# Patient Record
Sex: Female | Born: 1990 | Race: White | Hispanic: No | Marital: Married | Smoking: Former smoker
Health system: Southern US, Community
[De-identification: ages and names within clinical notes are randomized; demographics above are authoritative.]

## PROBLEM LIST (undated history)

## (undated) ENCOUNTER — Inpatient Hospital Stay (HOSPITAL_COMMUNITY): Payer: Self-pay

## (undated) DIAGNOSIS — K429 Umbilical hernia without obstruction or gangrene: Secondary | ICD-10-CM

## (undated) DIAGNOSIS — B2790 Infectious mononucleosis, unspecified without complication: Secondary | ICD-10-CM

## (undated) DIAGNOSIS — Z87442 Personal history of urinary calculi: Secondary | ICD-10-CM

## (undated) DIAGNOSIS — J020 Streptococcal pharyngitis: Secondary | ICD-10-CM

## (undated) HISTORY — PX: NO PAST SURGERIES: SHX2092

---

## 2015-01-31 ENCOUNTER — Encounter (HOSPITAL_COMMUNITY): Payer: Self-pay | Admitting: Nurse Practitioner

## 2015-01-31 ENCOUNTER — Emergency Department (HOSPITAL_COMMUNITY): Payer: Commercial Managed Care - HMO

## 2015-01-31 ENCOUNTER — Emergency Department (HOSPITAL_COMMUNITY)
Admission: EM | Admit: 2015-01-31 | Discharge: 2015-01-31 | Disposition: A | Discharge status: Home / Self Care | Payer: Commercial Managed Care - HMO | Attending: Emergency Medicine | Admitting: Emergency Medicine

## 2015-01-31 DIAGNOSIS — R1012 Left upper quadrant pain: Secondary | ICD-10-CM | POA: Diagnosis not present

## 2015-01-31 DIAGNOSIS — R109 Unspecified abdominal pain: Secondary | ICD-10-CM

## 2015-01-31 DIAGNOSIS — Z3A01 Less than 8 weeks gestation of pregnancy: Secondary | ICD-10-CM | POA: Diagnosis not present

## 2015-01-31 DIAGNOSIS — Z3491 Encounter for supervision of normal pregnancy, unspecified, first trimester: Secondary | ICD-10-CM

## 2015-01-31 DIAGNOSIS — O9989 Other specified diseases and conditions complicating pregnancy, childbirth and the puerperium: Secondary | ICD-10-CM | POA: Insufficient documentation

## 2015-01-31 DIAGNOSIS — O26899 Other specified pregnancy related conditions, unspecified trimester: Secondary | ICD-10-CM

## 2015-01-31 HISTORY — DX: Infectious mononucleosis, unspecified without complication: B27.90

## 2015-01-31 LAB — LIPASE, BLOOD: Lipase: 37 U/L (ref 22–51)

## 2015-01-31 LAB — URINALYSIS, ROUTINE W REFLEX MICROSCOPIC
Bilirubin Urine: NEGATIVE
GLUCOSE, UA: NEGATIVE mg/dL
KETONES UR: NEGATIVE mg/dL
LEUKOCYTES UA: NEGATIVE
Nitrite: NEGATIVE
PROTEIN: NEGATIVE mg/dL
Specific Gravity, Urine: 1.009 (ref 1.005–1.030)
UROBILINOGEN UA: 0.2 mg/dL (ref 0.0–1.0)
pH: 7.5 (ref 5.0–8.0)

## 2015-01-31 LAB — I-STAT BETA HCG BLOOD, ED (MC, WL, AP ONLY)

## 2015-01-31 LAB — COMPREHENSIVE METABOLIC PANEL
ALK PHOS: 59 U/L (ref 38–126)
ALT: 18 U/L (ref 14–54)
ANION GAP: 10 (ref 5–15)
AST: 24 U/L (ref 15–41)
Albumin: 4.4 g/dL (ref 3.5–5.0)
BILIRUBIN TOTAL: 1.1 mg/dL (ref 0.3–1.2)
BUN: 6 mg/dL (ref 6–20)
CALCIUM: 9.2 mg/dL (ref 8.9–10.3)
CO2: 21 mmol/L — ABNORMAL LOW (ref 22–32)
CREATININE: 0.59 mg/dL (ref 0.44–1.00)
Chloride: 102 mmol/L (ref 101–111)
Glucose, Bld: 83 mg/dL (ref 65–99)
Potassium: 3.8 mmol/L (ref 3.5–5.1)
SODIUM: 133 mmol/L — AB (ref 135–145)
TOTAL PROTEIN: 7.3 g/dL (ref 6.5–8.1)

## 2015-01-31 LAB — CBC
HCT: 40.7 % (ref 36.0–46.0)
HEMOGLOBIN: 14.9 g/dL (ref 12.0–15.0)
MCH: 32 pg (ref 26.0–34.0)
MCHC: 36.6 g/dL — ABNORMAL HIGH (ref 30.0–36.0)
MCV: 87.5 fL (ref 78.0–100.0)
PLATELETS: 226 10*3/uL (ref 150–400)
RBC: 4.65 MIL/uL (ref 3.87–5.11)
RDW: 12.5 % (ref 11.5–15.5)
WBC: 7.4 10*3/uL (ref 4.0–10.5)

## 2015-01-31 LAB — URINE MICROSCOPIC-ADD ON

## 2015-01-31 NOTE — ED Provider Notes (Signed)
CSN: [id number]     Arrival date & time 01/31/15  1302 History   First MD Initiated Contact with Patient 01/31/15 1409     Chief Complaint  Patient presents with  . Abdominal Pain     (Consider location/radiation/quality/duration/timing/severity/associated sxs/prior Treatment) Patient is a 24 y.o. female presenting with abdominal pain. The history is provided by the patient (Patient states that she's been having some left upper abdominal pain. It is improving now. Patient has a history of mono recently with elevated liver studies).  Abdominal Pain Pain location:  LUQ Pain quality: aching   Pain radiates to:  Does not radiate Pain severity:  Moderate Onset quality:  Gradual Timing:  Intermittent Chronicity:  New Context: not alcohol use   Associated symptoms: no chest pain, no cough, no diarrhea, no fatigue and no hematuria     Past Medical History  Diagnosis Date  . Mononucleosis    History reviewed. No pertinent past surgical history. History reviewed. No pertinent family history. Social History  Substance Use Topics  . Smoking status: Never Smoker   . Smokeless tobacco: None  . Alcohol Use: No   OB History    No data available     Review of Systems  Constitutional: Negative for appetite change and fatigue.  HENT: Negative for congestion, ear discharge and sinus pressure.   Eyes: Negative for discharge.  Respiratory: Negative for cough.   Cardiovascular: Negative for chest pain.  Gastrointestinal: Positive for abdominal pain. Negative for diarrhea.  Genitourinary: Negative for frequency and hematuria.  Musculoskeletal: Negative for back pain.  Skin: Negative for rash.  Neurological: Negative for seizures and headaches.  Psychiatric/Behavioral: Negative for hallucinations.      Allergies  Bee venom and Cinnamon  Home Medications   Prior to Admission medications   Not on File   BP 113/75 mmHg  Pulse 79  Temp(Src) 98.1 F (36.7 C) (Oral)  Resp 16  Ht   (1.626 m)  Wt 139 lb 3.2 oz (63.141 kg)  BMI 23.88 kg/m2  SpO2 100%  LMP 11/07/2014 Physical Exam  Constitutional: She is oriented to person, place, and time. She appears well-developed.  HENT:  Head: Normocephalic.  Eyes: Conjunctivae and EOM are normal. No scleral icterus.  Neck: Neck supple. No thyromegaly present.  Cardiovascular: Normal rate and regular rhythm.  Exam reveals no gallop and no friction rub.   No murmur heard. Pulmonary/Chest: No stridor. She has no wheezes. She has no rales. She exhibits no tenderness.  Abdominal: She exhibits no distension. There is no tenderness. There is no rebound.  Musculoskeletal: Normal range of motion. She exhibits no edema.  Lymphadenopathy:    She has no cervical adenopathy.  Neurological: She is oriented to person, place, and time. She exhibits normal muscle tone. Coordination normal.  Skin: No rash noted. No erythema.  Psychiatric: She has a normal mood and affect. Her behavior is normal.    ED Course  Procedures (including critical care time) Labs Review Labs Reviewed  COMPREHENSIVE METABOLIC PANEL - Abnormal; Notable for the following:    Sodium 133 (*)    CO2 21 (*)    All other components within normal limits  CBC - Abnormal; Notable for the following:    MCHC 36.6 (*)    All other components within normal limits  URINALYSIS, ROUTINE W REFLEX MICROSCOPIC (NOT AT [facility]) - Abnormal; Notable for the following:    Hgb urine dipstick TRACE (*)    All other components within normal limits  URINE MICROSCOPIC-ADD ON - Abnormal; Notable for the following:    Squamous Epithelial / LPF FEW (*)    All other components within normal limits  I-STAT BETA HCG BLOOD, ED ([facility], [facility], [facility] ONLY) - Abnormal; Notable for the following:    I-stat hCG, quantitative >2000.0 (*)    All other components within normal limits  LIPASE, BLOOD  HCG, SERUM, QUALITATIVE    Imaging Review [place] Abdomen Complete  01/31/2015  CLINICAL DATA:  Left side  abdominal pain for 1 day. EXAM: ULTRASOUND ABDOMEN COMPLETE COMPARISON:  None. FINDINGS: Gallbladder: No gallstones or wall thickening visualized. No sonographic Murphy sign noted. 0.3 cm polyp is noted. Common bile duct: Diameter: 0.2 cm Liver: No focal lesion identified. Within normal limits in parenchymal echogenicity. IVC: No abnormality visualized. Pancreas: Visualized portion unremarkable. Spleen: Size and appearance within normal limits. Right Kidney: Length: 9.7 cm. Echogenicity within normal limits. No mass or hydronephrosis visualized. Left Kidney: Length: 9.3 cm. Echogenicity within normal limits. No mass or hydronephrosis visualized. Abdominal aorta: No aneurysm visualized. Other findings: None. IMPRESSION: Negative for gallstones. No acute finding. 0.3 cm gallbladder polyp noted. Electronically Signed   By: Drusilla Kannerhomas  Dalessio M.D.   On: 01/31/2015 16:27   [place] Ob Comp Less 14 Wks  01/31/2015  CLINICAL DATA:  Left lower quadrant pain during early pregnancy. Uncertain LMP. EXAM: OBSTETRIC <14 WK US AND TRANSVAGINAL OB US TECHNIQUE: Both transabdominal and transvaginal ultrasound examinations were performed for complete evaluation of the gestation as well as the maternal uterus, adnexal regions, and pelvic cul-de-sac. Transvaginal technique was performed to assess early pregnancy. COMPARISON:  None. FINDINGS: Intrauterine gestational sac: Visualized/normal in shape. Yolk sac:  Visualized Embryo:  None visualized MSD:  13  mm   6 w   1 d Maternal uterus/adnexae: Both ovaries are normal in appearance. Small right ovarian corpus luteum noted. No adnexal mass identified. Tiny amount of free fluid noted. IMPRESSION: Single 6 week intrauterine gestational sac which contains a yolk sac. Recommend followup of quantitative HCG levels, and consider followup ultrasound to assess viability in 10 days. Electronically Signed   By: Myles RosenthalJohn  Stahl M.D.   On: 01/31/2015 16:36   [place] Ob Transvaginal  01/31/2015  CLINICAL  DATA:  Left lower quadrant pain during early pregnancy. Uncertain LMP. EXAM: OBSTETRIC <14 WK US AND TRANSVAGINAL OB US TECHNIQUE: Both transabdominal and transvaginal ultrasound examinations were performed for complete evaluation of the gestation as well as the maternal uterus, adnexal regions, and pelvic cul-de-sac. Transvaginal technique was performed to assess early pregnancy. COMPARISON:  None. FINDINGS: Intrauterine gestational sac: Visualized/normal in shape. Yolk sac:  Visualized Embryo:  None visualized MSD:  13  mm   6 w   1 d Maternal uterus/adnexae: Both ovaries are normal in appearance. Small right ovarian corpus luteum noted. No adnexal mass identified. Tiny amount of free fluid noted. IMPRESSION: Single 6 week intrauterine gestational sac which contains a yolk sac. Recommend followup of quantitative HCG levels, and consider followup ultrasound to assess viability in 10 days. Electronically Signed   By: Myles RosenthalJohn  Stahl M.D.   On: 01/31/2015 16:36   I have personally reviewed and evaluated these images and lab results as part of my medical decision-making.   EKG Interpretation None      MDM   Final diagnoses:  Normal IUP (intrauterine pregnancy) on prenatal ultrasound, first trimester    Labs unremarkable except positive pregnancy. Patient had ultrasound that showed she is [redacted] weeks pregnant with IUP. We also been ultrasound  of her abdomen her spleen and liver were completely normal patient's pain was improved by discharge. She is also told that her liver enzymes have now returned to normal. She is to follow with up with OB/GYN for her pregnancy    Bethann Berkshire, MD 01/31/15 [phone]

## 2015-01-31 NOTE — ED Notes (Signed)
Pt still in Ultrasound

## 2015-01-31 NOTE — Discharge Instructions (Signed)
Follow up with a gyn-ob md

## 2015-01-31 NOTE — ED Notes (Signed)
She reports mono with splenomegaly and elevated liver enzymes in august, dx at [facility]. States she was rechecked by PCP and liver enzymes were decreasing since, she was referred for GI appt which is upcoming. She developed LUQ pain this morning which is new for her. She also had + pregnancy test this week, she has not had period since July but has had a negative pregnancy test since July prior to the one this week.

## 2015-03-01 ENCOUNTER — Encounter (HOSPITAL_COMMUNITY): Payer: Self-pay | Admitting: *Deleted

## 2015-03-01 ENCOUNTER — Inpatient Hospital Stay (HOSPITAL_COMMUNITY)
Admission: AD | Admit: 2015-03-01 | Discharge: 2015-03-01 | Disposition: A | Discharge status: Home / Self Care | Payer: Commercial Managed Care - HMO | Source: Ambulatory Visit | Attending: Obstetrics and Gynecology | Admitting: Obstetrics and Gynecology

## 2015-03-01 DIAGNOSIS — Z3A10 10 weeks gestation of pregnancy: Secondary | ICD-10-CM | POA: Insufficient documentation

## 2015-03-01 DIAGNOSIS — O210 Mild hyperemesis gravidarum: Secondary | ICD-10-CM | POA: Diagnosis present

## 2015-03-01 DIAGNOSIS — O211 Hyperemesis gravidarum with metabolic disturbance: Secondary | ICD-10-CM | POA: Insufficient documentation

## 2015-03-01 LAB — CBC
HCT: 36.2 % (ref 36.0–46.0)
Hemoglobin: 13.8 g/dL (ref 12.0–15.0)
MCH: 32.4 pg (ref 26.0–34.0)
MCHC: 38.8 g/dL — ABNORMAL HIGH (ref 30.0–36.0)
MCV: 84 fL (ref 78.0–100.0)
PLATELETS: 225 10*3/uL (ref 150–400)
RBC: 4.31 MIL/uL (ref 3.87–5.11)
RDW: 13.1 % (ref 11.5–15.5)
WBC: 7.8 10*3/uL (ref 4.0–10.5)

## 2015-03-01 LAB — URINE MICROSCOPIC-ADD ON

## 2015-03-01 LAB — COMPREHENSIVE METABOLIC PANEL
ALT: 54 U/L (ref 14–54)
AST: 32 U/L (ref 15–41)
Albumin: 4.1 g/dL (ref 3.5–5.0)
Alkaline Phosphatase: 59 U/L (ref 38–126)
Anion gap: 13 (ref 5–15)
BUN: 7 mg/dL (ref 6–20)
CALCIUM: 9.2 mg/dL (ref 8.9–10.3)
CHLORIDE: 101 mmol/L (ref 101–111)
CO2: 17 mmol/L — ABNORMAL LOW (ref 22–32)
CREATININE: 0.54 mg/dL (ref 0.44–1.00)
Glucose, Bld: 250 mg/dL — ABNORMAL HIGH (ref 65–99)
Potassium: 3.8 mmol/L (ref 3.5–5.1)
Sodium: 131 mmol/L — ABNORMAL LOW (ref 135–145)
TOTAL PROTEIN: 6.5 g/dL (ref 6.5–8.1)
Total Bilirubin: 1.8 mg/dL — ABNORMAL HIGH (ref 0.3–1.2)

## 2015-03-01 LAB — URINALYSIS, ROUTINE W REFLEX MICROSCOPIC
Glucose, UA: NEGATIVE mg/dL
Leukocytes, UA: NEGATIVE
NITRITE: NEGATIVE
Protein, ur: NEGATIVE mg/dL
Specific Gravity, Urine: >1.03 — ABNORMAL HIGH (ref 1.005–1.030)
UROBILINOGEN UA: 1 mg/dL (ref 0.0–1.0)
pH: 6 (ref 5.0–8.0)

## 2015-03-01 MED ORDER — MECLIZINE HCL 25 MG PO TABS
25.0000 mg | ORAL_TABLET | Freq: Once | ORAL | Status: AC
  Administered 2015-03-01: 25 mg via ORAL
  Filled 2015-03-01: qty 1

## 2015-03-01 MED ORDER — MECLIZINE HCL 25 MG PO TABS: 25.0000 mg | ORAL_TABLET | Freq: Three times a day (TID) | ORAL | Status: DC | PRN

## 2015-03-01 MED ORDER — FAMOTIDINE IN NACL 20-0.9 MG/50ML-% IV SOLN
20.0000 mg | Freq: Once | INTRAVENOUS | Status: AC
  Administered 2015-03-01: 20 mg via INTRAVENOUS
  Filled 2015-03-01: qty 50

## 2015-03-01 MED ORDER — OMEPRAZOLE 20 MG PO CPDR: 20.0000 mg | DELAYED_RELEASE_CAPSULE | Freq: Every day | ORAL | Status: DC

## 2015-03-01 MED ORDER — PROMETHAZINE HCL 25 MG PO TABS: 25.0000 mg | ORAL_TABLET | Freq: Four times a day (QID) | ORAL | Status: DC | PRN

## 2015-03-01 MED ORDER — DEXTROSE 5 % IN LACTATED RINGERS IV BOLUS
1000.0000 mL | Freq: Once | INTRAVENOUS | Status: AC
  Administered 2015-03-01: 1000 mL via INTRAVENOUS

## 2015-03-01 MED ORDER — M.V.I. ADULT IV INJ
Freq: Once | INTRAVENOUS | Status: AC
  Administered 2015-03-01: 20:00:00 via INTRAVENOUS
  Filled 2015-03-01: qty 1000

## 2015-03-01 NOTE — MAU Provider Note (Signed)
History     CSN: [id number]  Arrival date and time: 03/01/15 1733   None     Chief Complaint  Patient presents with  . Morning Sickness   HPI Pt is 24 yo G1P0 @ [redacted]w[redacted]d who presents for hyperemesis- pt has had nausea and vomiting since dx of pregnancy 01/31/2015 at ED when seen for left upper abdominal pain after recent hx of elevated liver studies. Pt had an ultrasound at that time that confirmed IUP and liver enzymes had returned to normal. Pt has an appointment at [facility] on Dec 5.  Pt called [facility] and was told to come here for her nausea and vomiting. Pt states she has not been able to keep anything down since 10/16 including water. Yesterday she vomited 7 times.  Today she has not vomited since this morning. Pt has had both diarrhea and constipation, with gelatinous type stools. RN note: Editor: Ginger R Morris, RN (Designer, jewellery)     Expand All Collapse All   Pt presents to MAU with complaints of nausea and vomiting for approximately 3 weeks ago. Pt states that she vomits 7 to 8 times a day. Denies any vaginal bleeding or abnormal discharge      Past Medical History  Diagnosis Date  . Mononucleosis     History reviewed. No pertinent past surgical history.  History reviewed. No pertinent family history.  Social History  Substance Use Topics  . Smoking status: Never Smoker   . Smokeless tobacco: None  . Alcohol Use: No    Allergies:  Allergies  Allergen Reactions  . Bee Venom   . Cinnamon     No prescriptions prior to admission    Review of Systems  Constitutional: Negative for fever and chills.  Gastrointestinal: Positive for heartburn, nausea, vomiting, diarrhea and constipation. Negative for abdominal pain.  Genitourinary: Negative for dysuria and urgency.  Neurological: Positive for dizziness. Negative for headaches.   Physical Exam   Blood pressure 130/87, pulse 88, temperature 97.7 F (36.5 C), resp. rate 16, height   (1.651 m), weight 124 lb 12.8 oz (56.609 kg), last menstrual period 11/07/2014.  Physical Exam  Nursing note and vitals reviewed. Constitutional: She is oriented to person, place, and time. She appears well-developed and well-nourished. No distress.  HENT:  Head: Normocephalic.  Eyes: Pupils are equal, round, and reactive to light.  Neck: Normal range of motion. Neck supple.  Cardiovascular: Normal rate.   Respiratory: Effort normal.  GI: Soft.  Musculoskeletal: Normal range of motion.  Neurological: She is alert and oriented to person, place, and time.  Skin: Skin is warm and dry.  Psychiatric: She has a normal mood and affect.    MAU Course  Procedures Results for orders placed or performed during the hospital encounter of 03/01/15 (from the past 24 hour(s))  Urinalysis, Routine w reflex microscopic (not at [facility])     Status: Abnormal   Collection Time: 03/01/15  5:55 PM  Result Value Ref Range   Color, Urine AMBER (A) YELLOW   APPearance CLEAR CLEAR   Specific Gravity, Urine >1.030 (H) 1.005 - 1.030   pH 6.0 5.0 - 8.0   Glucose, UA NEGATIVE NEGATIVE mg/dL   Hgb urine dipstick LARGE (A) NEGATIVE   Bilirubin Urine SMALL (A) NEGATIVE   Ketones, ur >80 (A) NEGATIVE mg/dL   Protein, ur NEGATIVE NEGATIVE mg/dL   Urobilinogen, UA 1.0 0.0 - 1.0 mg/dL   Nitrite NEGATIVE NEGATIVE   Leukocytes, UA NEGATIVE NEGATIVE  Urine microscopic-add on     Status: Abnormal   Collection Time: 03/01/15  5:55 PM  Result Value Ref Range   Squamous Epithelial / LPF MANY (A) RARE   WBC, UA 0-2 <3 WBC/hpf   RBC / HPF 11-20 <3 RBC/hpf   Bacteria, UA MANY (A) RARE  CBC     Status: Abnormal   Collection Time: 03/01/15  7:00 PM  Result Value Ref Range   WBC 7.8 4.0 - 10.5 K/uL   RBC 4.31 3.87 - 5.11 MIL/uL   Hemoglobin 13.8 12.0 - 15.0 g/dL   HCT 16.136.2 09.636.0 - 04.546.0 %   MCV 84.0 78.0 - 100.0 fL   MCH 32.4 26.0 - 34.0 pg   MCHC 38.8 (H) 30.0 - 36.0 g/dL   RDW 40.913.1 81.111.5 - 91.415.5 %   Platelets 225 150  - 400 K/uL  Comprehensive metabolic panel     Status: Abnormal   Collection Time: 03/01/15  7:00 PM  Result Value Ref Range   Sodium 131 (L) 135 - 145 mmol/L   Potassium 3.8 3.5 - 5.1 mmol/L   Chloride 101 101 - 111 mmol/L   CO2 17 (L) 22 - 32 mmol/L   Glucose, Bld 250 (H) 65 - 99 mg/dL   BUN 7 6 - 20 mg/dL   Creatinine, Ser 7.820.54 0.44 - 1.00 mg/dL   Calcium 9.2 8.9 - 95.610.3 mg/dL   Total Protein 6.5 6.5 - 8.1 g/dL   Albumin 4.1 3.5 - 5.0 g/dL   AST 32 15 - 41 U/L   ALT 54 14 - 54 U/L   Alkaline Phosphatase 59 38 - 126 U/L   Total Bilirubin 1.8 (H) 0.3 - 1.2 mg/dL   GFR calc non Af Amer >60 >60 mL/min   GFR calc Af Amer >60 >60 mL/min   Anion gap 13 5 - 15  +FHR with doppler IV #1- D5LR      #2 LR with MVI; prevacid 20mg  IVPB meclezine 25mg  PO given Pt able to tolerate PO fluids and crackers Pt has not vomited while in MAU Discussed with Dr. Henderson CloudHorvath  Assessment and Plan  Hyperemesis Gravidarium with dehydration RX meclezine, prilosec and phenergan Viable 4085w2d IUP [facility]   LINEBERRY,SUSAN 03/01/2015, 6:56 PM

## 2015-03-01 NOTE — Discharge Instructions (Signed)

## 2015-03-01 NOTE — MAU Note (Signed)
Pt presents to MAU with complaints of nausea and vomiting for approximately 3 weeks ago. Pt states that she vomits 7 to 8 times a day. Denies any vaginal bleeding or abnormal discharge

## 2015-03-02 LAB — URINE CULTURE: CULTURE: NO GROWTH

## 2015-03-22 ENCOUNTER — Other Ambulatory Visit: Payer: Self-pay | Admitting: Obstetrics

## 2015-03-22 LAB — OB RESULTS CONSOLE ABO/RH: RH Type: POSITIVE

## 2015-03-22 LAB — OB RESULTS CONSOLE RPR: RPR: NONREACTIVE

## 2015-03-22 LAB — OB RESULTS CONSOLE HEPATITIS B SURFACE ANTIGEN: HEP B S AG: NEGATIVE

## 2015-03-22 LAB — OB RESULTS CONSOLE ANTIBODY SCREEN: Antibody Screen: NEGATIVE

## 2015-03-22 LAB — OB RESULTS CONSOLE RUBELLA ANTIBODY, IGM: Rubella: IMMUNE

## 2015-03-22 LAB — OB RESULTS CONSOLE GC/CHLAMYDIA
Chlamydia: NEGATIVE
GC PROBE AMP, GENITAL: NEGATIVE

## 2015-03-22 LAB — OB RESULTS CONSOLE HIV ANTIBODY (ROUTINE TESTING): HIV: NONREACTIVE

## 2015-03-23 LAB — CYTOLOGY - PAP

## 2015-04-18 NOTE — L&D Delivery Note (Signed)
Delivery Note Patient pushed for 2 hours after it was noted that she was C/C/+2. At 3:49 PM a viable and healthy female was delivered via Vaginal, Spontaneous Delivery (Presentation: Middle Occiput Anterior).  APGAR: 9, 9; weight 7 lb 11.8 oz (3510 g).   Placenta status: Intact, Spontaneous.  Cord: 3 vessels with the following complications: None.    Anesthesia: Epidural  Episiotomy: None Lacerations: 2nd degree Suture Repair: 2.0 vicryl Est. Blood Loss (mL): 300  Mom to postpartum.  Baby to Couplet care / Skin to Skin.  [name], [name] STACIA 09/28/2015, 10:41 PM

## 2015-05-19 DIAGNOSIS — J020 Streptococcal pharyngitis: Secondary | ICD-10-CM

## 2015-05-19 HISTORY — DX: Streptococcal pharyngitis: J02.0

## 2015-05-30 ENCOUNTER — Inpatient Hospital Stay (HOSPITAL_COMMUNITY)
Admission: AD | Admit: 2015-05-30 | Discharge: 2015-05-30 | Disposition: A | Discharge status: Home / Self Care | Payer: Commercial Managed Care - HMO | Source: Ambulatory Visit | Attending: Obstetrics and Gynecology | Admitting: Obstetrics and Gynecology

## 2015-05-30 ENCOUNTER — Encounter (HOSPITAL_COMMUNITY): Payer: Self-pay | Admitting: Student

## 2015-05-30 DIAGNOSIS — K529 Noninfective gastroenteritis and colitis, unspecified: Secondary | ICD-10-CM | POA: Diagnosis not present

## 2015-05-30 DIAGNOSIS — K5289 Other specified noninfective gastroenteritis and colitis: Secondary | ICD-10-CM | POA: Diagnosis not present

## 2015-05-30 DIAGNOSIS — O26892 Other specified pregnancy related conditions, second trimester: Secondary | ICD-10-CM | POA: Insufficient documentation

## 2015-05-30 DIAGNOSIS — O9989 Other specified diseases and conditions complicating pregnancy, childbirth and the puerperium: Secondary | ICD-10-CM | POA: Diagnosis not present

## 2015-05-30 DIAGNOSIS — Z3A23 23 weeks gestation of pregnancy: Secondary | ICD-10-CM | POA: Insufficient documentation

## 2015-05-30 DIAGNOSIS — R197 Diarrhea, unspecified: Secondary | ICD-10-CM | POA: Diagnosis present

## 2015-05-30 DIAGNOSIS — R112 Nausea with vomiting, unspecified: Secondary | ICD-10-CM | POA: Diagnosis present

## 2015-05-30 HISTORY — DX: Streptococcal pharyngitis: J02.0

## 2015-05-30 LAB — URINE MICROSCOPIC-ADD ON

## 2015-05-30 LAB — URINALYSIS, ROUTINE W REFLEX MICROSCOPIC
GLUCOSE, UA: 250 mg/dL — AB
LEUKOCYTES UA: NEGATIVE
NITRITE: NEGATIVE
PH: 6 (ref 5.0–8.0)
Protein, ur: 30 mg/dL — AB

## 2015-05-30 LAB — COMPREHENSIVE METABOLIC PANEL
ALBUMIN: 3.1 g/dL — AB (ref 3.5–5.0)
ALK PHOS: 74 U/L (ref 38–126)
ALT: 18 U/L (ref 14–54)
ANION GAP: 10 (ref 5–15)
AST: 37 U/L (ref 15–41)
BILIRUBIN TOTAL: 3.3 mg/dL — AB (ref 0.3–1.2)
BUN: 11 mg/dL (ref 6–20)
CALCIUM: 8.4 mg/dL — AB (ref 8.9–10.3)
CO2: 20 mmol/L — AB (ref 22–32)
CREATININE: 0.43 mg/dL — AB (ref 0.44–1.00)
Chloride: 103 mmol/L (ref 101–111)
GFR calc Af Amer: >60 mL/min (ref >60)
GFR calc non Af Amer: >60 mL/min (ref >60)
GLUCOSE: 140 mg/dL — AB (ref 65–99)
Potassium: 4.1 mmol/L (ref 3.5–5.1)
SODIUM: 133 mmol/L — AB (ref 135–145)
TOTAL PROTEIN: 6.7 g/dL (ref 6.5–8.1)

## 2015-05-30 LAB — CBC
HEMATOCRIT: 30.5 % — AB (ref 36.0–46.0)
HEMOGLOBIN: 11.2 g/dL — AB (ref 12.0–15.0)
MCH: 33.7 pg (ref 26.0–34.0)
MCHC: 36.7 g/dL — AB (ref 30.0–36.0)
MCV: 91.9 fL (ref 78.0–100.0)
Platelets: 220 10*3/uL (ref 150–400)
RBC: 3.32 MIL/uL — AB (ref 3.87–5.11)
RDW: 13.1 % (ref 11.5–15.5)
WBC: 7.4 10*3/uL (ref 4.0–10.5)

## 2015-05-30 MED ORDER — DEXTROSE 5 % IN LACTATED RINGERS IV BOLUS
1000.0000 mL | Freq: Once | INTRAVENOUS | Status: AC
  Administered 2015-05-30: 1000 mL via INTRAVENOUS

## 2015-05-30 NOTE — MAU Provider Note (Signed)
History     CSN: [id number]  Arrival date and time: 05/30/15 1537   First Provider Initiated Contact with Patient 05/30/15 1718      Chief Complaint  Patient presents with  . Emesis  . Diarrhea   HPI Comments: Melinda Phillips is a 25 y.o. G1P0 at [redacted]w[redacted]d who presents with n/v/d. Was diagnosed with strep throat at urgent care last Thursday & started on amoxicillin. N/v/d since day after starting amoxicillin.  Denies abdominal pain, vaginal bleeding, or LOF. Positive fetal movement.   Emesis  This is a new problem. Episode onset: x 2 days. The problem occurs 2 to 4 times per day. The problem has been unchanged. The emesis has an appearance of stomach contents. There has been no fever. Associated symptoms include coughing and diarrhea. Pertinent negatives include no abdominal pain, chills, dizziness, fever or headaches. She has tried nothing (has phenergan at home but didnt' know if she could take it with abx) for the symptoms.  Diarrhea  This is a new problem. Episode onset: x2 days. The problem occurs 2 to 4 times per day. The problem has been unchanged. Diarrhea characteristics: loose. The patient states that diarrhea does not awaken her from sleep. Associated symptoms include coughing and vomiting. Pertinent negatives include no abdominal pain, chills, fever or headaches. Nothing aggravates the symptoms. Risk factors include recent antibiotic use. She has tried nothing for the symptoms.    OB History    Gravida Para Term Preterm AB TAB SAB Ectopic Multiple Living   1               Past Medical History  Diagnosis Date  . Mononucleosis   . Strep throat 05/2015    Past Surgical History  Procedure Laterality Date  . No past surgeries      History reviewed. No pertinent family history.  Social History  Substance Use Topics  . Smoking status: Never Smoker   . Smokeless tobacco: None  . Alcohol Use: No    Allergies:  Allergies  Allergen Reactions  . Bee Venom Anaphylaxis   . Cinnamon Anaphylaxis    No prescriptions prior to admission    Review of Systems  Constitutional: Negative.  Negative for fever and chills.  HENT: Positive for sore throat (reports symptom improvement since abx).   Respiratory: Positive for cough.   Gastrointestinal: Positive for vomiting and diarrhea. Negative for abdominal pain.  Genitourinary: Negative.   Neurological: Negative for dizziness and headaches.   Physical Exam   Blood pressure 118/76, pulse 98, temperature 97.6 F (36.4 C), temperature source Oral, resp. rate 18, last menstrual period 11/07/2014.  Physical Exam  Nursing note and vitals reviewed. Constitutional: She is oriented to person, place, and time. She appears well-developed and well-nourished. No distress.  HENT:  Head: Normocephalic and atraumatic.  Eyes: Conjunctivae are normal. Right eye exhibits no discharge. Left eye exhibits no discharge. No scleral icterus.  Neck: Normal range of motion.  Cardiovascular: Normal rate, regular rhythm and normal heart sounds.   No murmur heard. Respiratory: Effort normal and breath sounds normal. No respiratory distress. She has no wheezes.  GI: Soft. Bowel sounds are normal. There is no tenderness.  Neurological: She is alert and oriented to person, place, and time.  Skin: Skin is warm and dry. She is not diaphoretic.  Psychiatric: She has a normal mood and affect. Her behavior is normal. Judgment and thought content normal.   Fetal Tracing:  Baseline: 150  - active fetus, very difficult to  trace. Intermittent tracing Variability: moderate Accelerations:  Decelerations:  Toco: none   MAU Course  Procedures Results for orders placed or performed during the hospital encounter of 05/30/15 (from the past 24 hour(s))  Urinalysis, Routine w reflex microscopic (not at [facility])     Status: Abnormal   Collection Time: 05/30/15  3:45 PM  Result Value Ref Range   Color, Urine AMBER (A) YELLOW   APPearance HAZY (A)  CLEAR   Specific Gravity, Urine >1.030 (H) 1.005 - 1.030   pH 6.0 5.0 - 8.0   Glucose, UA 250 (A) NEGATIVE mg/dL   Hgb urine dipstick TRACE (A) NEGATIVE   Bilirubin Urine LARGE (A) NEGATIVE   Ketones, ur >80 (A) NEGATIVE mg/dL   Protein, ur 30 (A) NEGATIVE mg/dL   Nitrite NEGATIVE NEGATIVE   Leukocytes, UA NEGATIVE NEGATIVE  Urine microscopic-add on     Status: Abnormal   Collection Time: 05/30/15  3:45 PM  Result Value Ref Range   Squamous Epithelial / LPF 6-30 (A) NONE SEEN   WBC, UA 0-5 0 - 5 WBC/hpf   RBC / HPF 0-5 0 - 5 RBC/hpf   Bacteria, UA FEW (A) NONE SEEN  CBC     Status: Abnormal   Collection Time: 05/30/15  4:53 PM  Result Value Ref Range   WBC 7.4 4.0 - 10.5 K/uL   RBC 3.32 (L) 3.87 - 5.11 MIL/uL   Hemoglobin 11.2 (L) 12.0 - 15.0 g/dL   HCT 16.1 (L) 09.6 - 04.5 %   MCV 91.9 78.0 - 100.0 fL   MCH 33.7 26.0 - 34.0 pg   MCHC 36.7 (H) 30.0 - 36.0 g/dL   RDW 40.9 81.1 - 91.4 %   Platelets 220 150 - 400 K/uL  Comprehensive metabolic panel     Status: Abnormal   Collection Time: 05/30/15  4:53 PM  Result Value Ref Range   Sodium 133 (L) 135 - 145 mmol/L   Potassium 4.1 3.5 - 5.1 mmol/L   Chloride 103 101 - 111 mmol/L   CO2 20 (L) 22 - 32 mmol/L   Glucose, Bld 140 (H) 65 - 99 mg/dL   BUN 11 6 - 20 mg/dL   Creatinine, Ser 7.82 (L) 0.44 - 1.00 mg/dL   Calcium 8.4 (L) 8.9 - 10.3 mg/dL   Total Protein 6.7 6.5 - 8.1 g/dL   Albumin 3.1 (L) 3.5 - 5.0 g/dL   AST 37 15 - 41 U/L   ALT 18 14 - 54 U/L   Alkaline Phosphatase 74 38 - 126 U/L   Total Bilirubin 3.3 (H) 0.3 - 1.2 mg/dL   GFR calc non Af Amer >60 >60 mL/min   GFR calc Af Amer >60 >60 mL/min   Anion gap 10 5 - 15    MDM IV started, D5LR bolus Patient denies nausea at this time. Has not had episode of diarrhea since this morning.  1754- S/w Dr. Dareen Piano. Pt has f/u in office on Thursday. Ok to discharge home.   Assessment and Plan  A: 1. Other noninfectious gastroenteritis     P; Discharge home Continue  antibiotics as prescribed Can take phenergan prn n/v Discussed reasons to return Keep f/u appt with OB  Judeth Horn 05/30/2015, 7:26 PM

## 2015-05-30 NOTE — Discharge Instructions (Signed)

## 2015-05-30 NOTE — MAU Note (Signed)
Pt reports she was Dx with strep throat last week. Taking amoxacillian and musinex. Throat feels better but is c/o N/V/D. Has not kep anything down since Friday.3

## 2015-08-26 LAB — OB RESULTS CONSOLE GC/CHLAMYDIA
CHLAMYDIA, DNA PROBE: NEGATIVE
Gonorrhea: NEGATIVE

## 2015-08-26 LAB — OB RESULTS CONSOLE GBS: GBS: NEGATIVE

## 2015-09-21 ENCOUNTER — Encounter (HOSPITAL_COMMUNITY): Payer: Self-pay | Admitting: *Deleted

## 2015-09-21 ENCOUNTER — Inpatient Hospital Stay (HOSPITAL_COMMUNITY)
Admission: AD | Admit: 2015-09-21 | Discharge: 2015-09-21 | Disposition: A | Discharge status: Home / Self Care | Payer: Commercial Managed Care - HMO | Source: Ambulatory Visit | Attending: Obstetrics and Gynecology | Admitting: Obstetrics and Gynecology

## 2015-09-21 DIAGNOSIS — R197 Diarrhea, unspecified: Secondary | ICD-10-CM

## 2015-09-21 DIAGNOSIS — O99613 Diseases of the digestive system complicating pregnancy, third trimester: Secondary | ICD-10-CM

## 2015-09-21 DIAGNOSIS — Z3A39 39 weeks gestation of pregnancy: Secondary | ICD-10-CM | POA: Diagnosis not present

## 2015-09-21 DIAGNOSIS — K529 Noninfective gastroenteritis and colitis, unspecified: Secondary | ICD-10-CM | POA: Insufficient documentation

## 2015-09-21 DIAGNOSIS — O26893 Other specified pregnancy related conditions, third trimester: Secondary | ICD-10-CM | POA: Insufficient documentation

## 2015-09-21 DIAGNOSIS — O219 Vomiting of pregnancy, unspecified: Secondary | ICD-10-CM

## 2015-09-21 LAB — URINE MICROSCOPIC-ADD ON: RBC / HPF: NONE SEEN RBC/hpf (ref 0–5)

## 2015-09-21 LAB — URINALYSIS, ROUTINE W REFLEX MICROSCOPIC
Bilirubin Urine: NEGATIVE
Glucose, UA: NEGATIVE mg/dL
HGB URINE DIPSTICK: NEGATIVE
KETONES UR: NEGATIVE mg/dL
Nitrite: NEGATIVE
PROTEIN: NEGATIVE mg/dL
Specific Gravity, Urine: 1.01 (ref 1.005–1.030)
pH: 7 (ref 5.0–8.0)

## 2015-09-21 NOTE — MAU Provider Note (Signed)
Chief Complaint:  Nausea; Emesis; and Diarrhea   First Provider Initiated Contact with Patient 09/21/15 1251      HPI: Melinda ShieldsCaitlin Phillips is a 25 y.o. G1P0 at [id number] who presents to maternity admissions reporting onset of nausea and vomiting 2 days ago, with vomiting 3-4x daily until today, then onset of diarrhea with 4 episodes today.  She also felt dizziness last night which resolved.  She reports she took phenergan last night, which helped, but has not taken any medications today.  She called the office and was told to come to the hospital if she had diarrhea because she might be dehydrated.  She reports good fetal movement, denies LOF, vaginal bleeding, vaginal itching/burning, urinary symptoms, h/a, dizziness, n/v, or fever/chills.    HPI  Past Medical History: Past Medical History  Diagnosis Date  . Mononucleosis   . Strep throat 05/2015    Past obstetric history: OB History  Gravida Para Term Preterm AB SAB TAB Ectopic Multiple Living  1             # Outcome Date GA Lbr Len/2nd Weight Sex Delivery Anes PTL Lv  1 Current               Past Surgical History: Past Surgical History  Procedure Laterality Date  . No past surgeries      Family History: Family History  Problem Relation Age of Onset  . Hypertension Mother   . Hypertension Father   . Hypertension Maternal Grandmother   . Diabetes Maternal Grandmother   . Hypertension Maternal Grandfather   . Hypertension Paternal Grandmother   . Hypertension Paternal Grandfather   . Heart disease Paternal Grandfather     Social History: Social History  Substance Use Topics  . Smoking status: Never Smoker   . Smokeless tobacco: None  . Alcohol Use: No    Allergies:  Allergies  Allergen Reactions  . Bee Venom Anaphylaxis  . Cinnamon Anaphylaxis    Meds:  Prescriptions prior to admission  Medication Sig Dispense Refill Last Dose  . acetaminophen (TYLENOL) 500 MG tablet Take 500 mg by mouth every 6 (six) hours as  needed.   09/19/2015  . omeprazole (PRILOSEC) 20 MG capsule Take 1 capsule (20 mg total) by mouth daily. 30 capsule 0 09/07/2015  . Prenatal Vit-Fe Fumarate-FA (PRENATAL MULTIVITAMIN) TABS tablet Take 1 tablet by mouth daily at 12 noon.   09/19/2015  . promethazine (PHENERGAN) 25 MG tablet Take 1 tablet (25 mg total) by mouth every 6 (six) hours as needed for nausea or vomiting. 30 tablet 0 09/20/2015 at Unknown time  . meclizine (ANTIVERT) 25 MG tablet Take 1 tablet (25 mg total) by mouth 3 (three) times daily as needed for dizziness. (Patient not taking: Reported on 09/21/2015) 30 tablet 0 Past Month at Unknown time    ROS:  Review of Systems  Constitutional: Negative for fever, chills and fatigue.  Respiratory: Negative for shortness of breath.   Cardiovascular: Negative for chest pain.  Gastrointestinal: Positive for nausea, vomiting and diarrhea.  Genitourinary: Negative for dysuria, flank pain, vaginal bleeding, vaginal discharge, difficulty urinating, vaginal pain and pelvic pain.  Neurological: Negative for dizziness and headaches.  Psychiatric/Behavioral: Negative.      I have reviewed patient's Past Medical Hx, Surgical Hx, Family Hx, Social Hx, medications and allergies.   Physical Exam   Patient Vitals for the past 24 hrs:  BP Temp Temp src Pulse Resp SpO2  09/21/15 1128 102/89 mmHg 97.6 F (36.4 C)  Oral 80 16 100 %   Constitutional: Well-developed, well-nourished female in no acute distress.  Cardiovascular: normal rate Respiratory: normal effort GI: Abd soft, non-tender, gravid appropriate for gestational age.  MS: Extremities nontender, no edema, normal ROM Neurologic: Alert and oriented x 4.  GU: Neg CVAT.  PELVIC EXAM: Cervix pink, visually closed, without lesion, scant white creamy discharge, vaginal walls and external genitalia normal Bimanual exam: Cervix 0/long/high, firm, anterior, neg CMT, uterus nontender, nonenlarged, adnexa without tenderness, enlargement, or  mass  Dilation: 1 Effacement (%): 50 Cervical Position: Posterior Presentation: Vertex Exam by:: Lias Leftwich-Kirby CNM  FHT:  Baseline 125 , moderate variability, accelerations present, no decelerations Contractions: irritability with occasional contractions that are mild to palpation   Labs: Results for orders placed or performed during the hospital encounter of 09/21/15 (from the past 24 hour(s))  Urinalysis, Routine w reflex microscopic (not at [facility])     Status: Abnormal   Collection Time: 09/21/15 11:00 AM  Result Value Ref Range   Color, Urine YELLOW YELLOW   APPearance CLEAR CLEAR   Specific Gravity, Urine 1.010 1.005 - 1.030   pH 7.0 5.0 - 8.0   Glucose, UA NEGATIVE NEGATIVE mg/dL   Hgb urine dipstick NEGATIVE NEGATIVE   Bilirubin Urine NEGATIVE NEGATIVE   Ketones, ur NEGATIVE NEGATIVE mg/dL   Protein, ur NEGATIVE NEGATIVE mg/dL   Nitrite NEGATIVE NEGATIVE   Leukocytes, UA TRACE (A) NEGATIVE  Urine microscopic-add on     Status: Abnormal   Collection Time: 09/21/15 11:00 AM  Result Value Ref Range   Squamous Epithelial / LPF 6-30 (A) NONE SEEN   WBC, UA 6-30 0 - 5 WBC/hpf   RBC / HPF NONE SEEN 0 - 5 RBC/hpf   Bacteria, UA MANY (A) NONE SEEN   Casts HYALINE CASTS (A) NEGATIVE   Urine-Other MUCOUS PRESENT       Imaging:  No results found.  MAU Course/MDM: I have ordered labs and reviewed results.  No signs of dehydration or active labor.  Consult Dr Claiborne Billings.  Encouraged increased PO fluids, may take Phenergan as prescribed for nausea and OTC Imodium PRN.  Urine sent for culture r/t increased WBCs.  Pt stable at time of discharge.  Assessment: 1. Noninfectious gastroenteritis, unspecified   2. Nausea and vomiting during pregnancy   3. Diarrhea, unspecified type     Plan: Discharge home Labor precautions and fetal kick counts      Follow-up Information    Follow up with CALLAHAN, SIDNEY, DO.   Specialty:  Obstetrics and Gynecology   Why:  As  scheduled, Return to MAU as needed for emergencies   Contact information:   [address] [place] [address] [phone]        Medication List    STOP taking these medications        meclizine 25 MG tablet  Commonly known as:  ANTIVERT      TAKE these medications        acetaminophen 500 MG tablet  Commonly known as:  TYLENOL  Take 500 mg by mouth every 6 (six) hours as needed.     omeprazole 20 MG capsule  Commonly known as:  PRILOSEC  Take 1 capsule (20 mg total) by mouth daily.     prenatal multivitamin Tabs tablet  Take 1 tablet by mouth daily at 12 noon.     promethazine 25 MG tablet  Commonly known as:  PHENERGAN  Take 1 tablet (25 mg total) by  mouth every 6 (six) hours as needed for nausea or vomiting.        Sharen Counter Certified Nurse-Midwife 09/21/2015 1:18 PM

## 2015-09-21 NOTE — MAU Note (Signed)
Urine in lab 

## 2015-09-21 NOTE — MAU Note (Signed)
C/o Nausea & vomiting since Sunday morning and the diarrhea started this Am;

## 2015-09-21 NOTE — Discharge Instructions (Signed)

## 2015-09-22 LAB — CULTURE, OB URINE: Culture: NO GROWTH

## 2015-09-25 ENCOUNTER — Encounter (HOSPITAL_COMMUNITY): Payer: Self-pay | Admitting: *Deleted

## 2015-09-25 ENCOUNTER — Inpatient Hospital Stay (HOSPITAL_COMMUNITY)
Admission: AD | Admit: 2015-09-25 | Discharge: 2015-09-25 | Disposition: A | Discharge status: Home / Self Care | Payer: Commercial Managed Care - HMO | Source: Ambulatory Visit | Attending: Obstetrics and Gynecology | Admitting: Obstetrics and Gynecology

## 2015-09-25 NOTE — MAU Note (Signed)
Contractions since last night. More intense at time. Tonight pain going toward lower back and thought something might be going on. Denies LOF or bleeding. Good FM

## 2015-09-27 ENCOUNTER — Encounter (HOSPITAL_COMMUNITY): Payer: Self-pay

## 2015-09-27 ENCOUNTER — Inpatient Hospital Stay (HOSPITAL_COMMUNITY)
Admission: AD | Admit: 2015-09-27 | Discharge: 2015-09-30 | DRG: 775 | Disposition: A | Discharge status: Home / Self Care | Payer: Commercial Managed Care - HMO | Source: Ambulatory Visit | Attending: Obstetrics & Gynecology | Admitting: Obstetrics & Gynecology

## 2015-09-27 DIAGNOSIS — Z3A40 40 weeks gestation of pregnancy: Secondary | ICD-10-CM

## 2015-09-27 DIAGNOSIS — K219 Gastro-esophageal reflux disease without esophagitis: Secondary | ICD-10-CM | POA: Diagnosis present

## 2015-09-27 DIAGNOSIS — O9962 Diseases of the digestive system complicating childbirth: Secondary | ICD-10-CM | POA: Diagnosis present

## 2015-09-27 DIAGNOSIS — Z833 Family history of diabetes mellitus: Secondary | ICD-10-CM | POA: Diagnosis not present

## 2015-09-27 DIAGNOSIS — Z87891 Personal history of nicotine dependence: Secondary | ICD-10-CM

## 2015-09-27 DIAGNOSIS — IMO0001 Reserved for inherently not codable concepts without codable children: Secondary | ICD-10-CM

## 2015-09-27 DIAGNOSIS — Z8249 Family history of ischemic heart disease and other diseases of the circulatory system: Secondary | ICD-10-CM

## 2015-09-27 LAB — CBC
HCT: 28.1 % — ABNORMAL LOW (ref 36.0–46.0)
Hemoglobin: 9 g/dL — ABNORMAL LOW (ref 12.0–15.0)
MCH: 25.5 pg — ABNORMAL LOW (ref 26.0–34.0)
MCHC: 32 g/dL (ref 30.0–36.0)
MCV: 79.6 fL (ref 78.0–100.0)
Platelets: 282 10*3/uL (ref 150–400)
RBC: 3.53 MIL/uL — ABNORMAL LOW (ref 3.87–5.11)
RDW: 15.3 % (ref 11.5–15.5)
WBC: 10.6 10*3/uL — ABNORMAL HIGH (ref 4.0–10.5)

## 2015-09-27 LAB — TYPE AND SCREEN
ABO/RH(D): AB POS
ANTIBODY SCREEN: NEGATIVE

## 2015-09-27 MED ORDER — OXYTOCIN 40 UNITS IN LACTATED RINGERS INFUSION - SIMPLE MED: 2.5000 [IU]/h | INTRAVENOUS | Status: DC

## 2015-09-27 MED ORDER — OXYCODONE-ACETAMINOPHEN 5-325 MG PO TABS: 2.0000 | ORAL_TABLET | ORAL | Status: DC | PRN

## 2015-09-27 MED ORDER — ONDANSETRON HCL 4 MG/2ML IJ SOLN
4.0000 mg | Freq: Four times a day (QID) | INTRAMUSCULAR | Status: DC | PRN
  Administered 2015-09-28: 4 mg via INTRAVENOUS
  Filled 2015-09-27: qty 2

## 2015-09-27 MED ORDER — LACTATED RINGERS IV SOLN
INTRAVENOUS | Status: DC
  Administered 2015-09-28: 08:00:00 via INTRAVENOUS

## 2015-09-27 MED ORDER — OXYTOCIN BOLUS FROM INFUSION
500.0000 mL | INTRAVENOUS | Status: DC
  Administered 2015-09-28: 500 mL via INTRAVENOUS

## 2015-09-27 MED ORDER — ACETAMINOPHEN 325 MG PO TABS
650.0000 mg | ORAL_TABLET | ORAL | Status: DC | PRN
  Administered 2015-09-28: 650 mg via ORAL
  Filled 2015-09-27 (×2): qty 2

## 2015-09-27 MED ORDER — LACTATED RINGERS IV SOLN: 500.0000 mL | INTRAVENOUS | Status: DC | PRN

## 2015-09-27 MED ORDER — FLEET ENEMA 7-19 GM/118ML RE ENEM: 1.0000 | ENEMA | RECTAL | Status: DC | PRN

## 2015-09-27 MED ORDER — SOD CITRATE-CITRIC ACID 500-334 MG/5ML PO SOLN: 30.0000 mL | ORAL | Status: DC | PRN

## 2015-09-27 MED ORDER — LIDOCAINE HCL (PF) 1 % IJ SOLN
30.0000 mL | INTRAMUSCULAR | Status: DC | PRN
  Administered 2015-09-28: 30 mL via SUBCUTANEOUS
  Filled 2015-09-27: qty 30

## 2015-09-27 MED ORDER — OXYCODONE-ACETAMINOPHEN 5-325 MG PO TABS: 1.0000 | ORAL_TABLET | ORAL | Status: DC | PRN

## 2015-09-27 NOTE — MAU Note (Signed)
Pt presents complaining of contractions every 4-5 minutes for 2 hours and increased discharge. Denies bleeding or leaking. Reports good fetal movement.

## 2015-09-28 ENCOUNTER — Inpatient Hospital Stay (HOSPITAL_COMMUNITY): Payer: Commercial Managed Care - HMO | Admitting: Anesthesiology

## 2015-09-28 LAB — ABO/RH: ABO/RH(D): AB POS

## 2015-09-28 LAB — RPR: RPR: NONREACTIVE

## 2015-09-28 MED ORDER — WITCH HAZEL-GLYCERIN EX PADS
1.0000 "application " | MEDICATED_PAD | CUTANEOUS | Status: DC | PRN
  Administered 2015-09-28: 1 via TOPICAL

## 2015-09-28 MED ORDER — LACTATED RINGERS IV SOLN: 500.0000 mL | Freq: Once | INTRAVENOUS | Status: DC

## 2015-09-28 MED ORDER — EPHEDRINE 5 MG/ML INJ
10.0000 mg | INTRAVENOUS | Status: DC | PRN
  Filled 2015-09-28: qty 2

## 2015-09-28 MED ORDER — EPHEDRINE 5 MG/ML INJ: 10.0000 mg | INTRAVENOUS | Status: DC | PRN

## 2015-09-28 MED ORDER — IBUPROFEN 600 MG PO TABS
600.0000 mg | ORAL_TABLET | Freq: Four times a day (QID) | ORAL | Status: DC
  Administered 2015-09-28 – 2015-09-30 (×6): 600 mg via ORAL
  Filled 2015-09-28 (×6): qty 1

## 2015-09-28 MED ORDER — BENZOCAINE-MENTHOL 20-0.5 % EX AERO
1.0000 "application " | INHALATION_SPRAY | CUTANEOUS | Status: DC | PRN
  Administered 2015-09-28 – 2015-09-29 (×2): 1 via TOPICAL
  Filled 2015-09-28 (×2): qty 56

## 2015-09-28 MED ORDER — DIPHENHYDRAMINE HCL 50 MG/ML IJ SOLN: 12.5000 mg | INTRAMUSCULAR | Status: DC | PRN

## 2015-09-28 MED ORDER — TERBUTALINE SULFATE 1 MG/ML IJ SOLN: 0.2500 mg | Freq: Once | INTRAMUSCULAR | Status: DC | PRN

## 2015-09-28 MED ORDER — ONDANSETRON HCL 4 MG/2ML IJ SOLN: 4.0000 mg | INTRAMUSCULAR | Status: DC | PRN

## 2015-09-28 MED ORDER — PHENYLEPHRINE 40 MCG/ML (10ML) SYRINGE FOR IV PUSH (FOR BLOOD PRESSURE SUPPORT)
80.0000 ug | PREFILLED_SYRINGE | INTRAVENOUS | Status: DC | PRN
  Filled 2015-09-28: qty 5

## 2015-09-28 MED ORDER — DIBUCAINE 1 % RE OINT: 1.0000 "application " | TOPICAL_OINTMENT | RECTAL | Status: DC | PRN

## 2015-09-28 MED ORDER — TETANUS-DIPHTH-ACELL PERTUSSIS 5-2.5-18.5 LF-MCG/0.5 IM SUSP: 0.5000 mL | Freq: Once | INTRAMUSCULAR | Status: DC

## 2015-09-28 MED ORDER — ONDANSETRON HCL 4 MG PO TABS: 4.0000 mg | ORAL_TABLET | ORAL | Status: DC | PRN

## 2015-09-28 MED ORDER — PHENYLEPHRINE 40 MCG/ML (10ML) SYRINGE FOR IV PUSH (FOR BLOOD PRESSURE SUPPORT)
80.0000 ug | PREFILLED_SYRINGE | INTRAVENOUS | Status: DC | PRN
  Filled 2015-09-28: qty 10
  Filled 2015-09-28: qty 5

## 2015-09-28 MED ORDER — FENTANYL 2.5 MCG/ML BUPIVACAINE 1/10 % EPIDURAL INFUSION (WH - ANES)
14.0000 mL/h | INTRAMUSCULAR | Status: DC | PRN
  Administered 2015-09-28 (×2): 14 mL/h via EPIDURAL
  Filled 2015-09-28 (×2): qty 125

## 2015-09-28 MED ORDER — LIDOCAINE HCL (PF) 1 % IJ SOLN
INTRAMUSCULAR | Status: DC | PRN
  Administered 2015-09-28 (×2): 4 mL via EPIDURAL

## 2015-09-28 MED ORDER — COCONUT OIL OIL: 1.0000 "application " | TOPICAL_OIL | Status: DC | PRN

## 2015-09-28 MED ORDER — SIMETHICONE 80 MG PO CHEW: 80.0000 mg | CHEWABLE_TABLET | ORAL | Status: DC | PRN

## 2015-09-28 MED ORDER — SENNOSIDES-DOCUSATE SODIUM 8.6-50 MG PO TABS
2.0000 | ORAL_TABLET | ORAL | Status: DC
  Administered 2015-09-28 – 2015-09-29 (×2): 2 via ORAL
  Filled 2015-09-28 (×2): qty 2

## 2015-09-28 MED ORDER — DIPHENHYDRAMINE HCL 25 MG PO CAPS: 25.0000 mg | ORAL_CAPSULE | Freq: Four times a day (QID) | ORAL | Status: DC | PRN

## 2015-09-28 MED ORDER — ACETAMINOPHEN 325 MG PO TABS
650.0000 mg | ORAL_TABLET | ORAL | Status: DC | PRN
  Administered 2015-09-29: 650 mg via ORAL

## 2015-09-28 MED ORDER — PRENATAL MULTIVITAMIN CH
1.0000 | ORAL_TABLET | Freq: Every day | ORAL | Status: DC
  Administered 2015-09-29: 1 via ORAL
  Filled 2015-09-28: qty 1

## 2015-09-28 MED ORDER — ZOLPIDEM TARTRATE 5 MG PO TABS
5.0000 mg | ORAL_TABLET | Freq: Every evening | ORAL | Status: DC | PRN
Start: 2015-09-28 — End: 2015-09-30

## 2015-09-28 MED ORDER — OXYTOCIN 40 UNITS IN LACTATED RINGERS INFUSION - SIMPLE MED
1.0000 m[IU]/min | INTRAVENOUS | Status: DC
  Administered 2015-09-28: 2 m[IU]/min via INTRAVENOUS
  Filled 2015-09-28: qty 1000

## 2015-09-28 NOTE — Progress Notes (Signed)
Dr Ezequiel EssexGable requested that pt go to nursery to see newborn before being transferred to NICU.  Pt placed back into wheelchair and taken to nursery before being admitted

## 2015-09-28 NOTE — Anesthesia Procedure Notes (Signed)
Epidural Patient location during procedure: OB Start time: 09/28/2015 1:28 AM End time: 09/28/2015 1:34 AM  Staffing Anesthesiologist: Shona SimpsonHOLLIS, KEVIN D Performed by: anesthesiologist   Preanesthetic Checklist Completed: patient identified, site marked, surgical consent, pre-op evaluation, timeout performed, IV checked, risks and benefits discussed and monitors and equipment checked  Epidural Patient position: sitting Prep: ChloraPrep Patient monitoring: heart rate, continuous pulse ox and blood pressure Approach: midline Location: L3-L4 Injection technique: LOR saline  Needle:  Needle type: Tuohy  Needle gauge: 17 G Needle length: 9 cm Catheter type: closed end flexible Catheter size: 20 Guage Test dose: negative and 1.5% lidocaine  Assessment Events: blood not aspirated, injection not painful, no injection resistance and no paresthesia  Additional Notes LOR @ 6  Patient identified. Risks/Benefits/Options discussed with patient including but not limited to bleeding, infection, nerve damage, paralysis, failed block, incomplete pain control, headache, blood pressure changes, nausea, vomiting, reactions to medications, itching and postpartum back pain. Confirmed with bedside nurse the patient's most recent platelet count. Confirmed with patient that they are not currently taking any anticoagulation, have any bleeding history or any family history of bleeding disorders. Patient expressed understanding and wished to proceed. All questions were answered. Sterile technique was used throughout the entire procedure. Please see nursing notes for vital signs. Test dose was given through epidural catheter and negative prior to continuing to dose epidural or start infusion. Warning signs of high block given to the patient including shortness of breath, tingling/numbness in hands, complete motor block, or any concerning symptoms with instructions to call for help. Patient was given instructions on  fall risk and not to get out of bed. All questions and concerns addressed with instructions to call with any issues or inadequate analgesia.    Reason for block:procedure for pain

## 2015-09-28 NOTE — Anesthesia Pain Management Evaluation Note (Signed)
  CRNA Pain Management Visit Note  Patient: Melinda ShieldsCaitlin Lannan, 25 y.o., female  "Hello I am a member of the anesthesia team at [facility]. We have an anesthesia team available at all times to provide care throughout the hospital, including epidural management and anesthesia for C-section. I don't know your plan for the delivery whether it a natural birth, water birth, IV sedation, nitrous supplementation, doula or epidural, but we want to meet your pain goals."   1.Was your pain managed to your expectations on prior hospitalizations?   No prior hospitalizations  2.What is your expectation for pain management during this hospitalization?     Epidural  3.How can we help you reach that goal? epidural  Record the patient's initial score and the patient's pain goal.   Pain: 0 Epidural is working well  Pain Goal: 4 [facility] wants you to be able to say your pain was always managed very well.  Kinley Dozier 09/28/2015

## 2015-09-28 NOTE — Anesthesia Preprocedure Evaluation (Signed)
Anesthesia Evaluation   Patient awake    Reviewed: Allergy & Precautions, NPO status , Patient's Chart, lab work & pertinent test results  Airway Mallampati: II  TM Distance: >3 FB Neck ROM: Full    Dental  (+) Teeth Intact, Dental Advisory Given   Pulmonary former smoker,    breath sounds clear to auscultation       Cardiovascular  Rhythm:Regular Rate:Normal     Neuro/Psych negative neurological ROS  negative psych ROS   GI/Hepatic GERD  Medicated,  Endo/Other    Renal/GU   negative genitourinary   Musculoskeletal   Abdominal   Peds negative pediatric ROS (+)  Hematology negative hematology ROS (+)   Anesthesia Other Findings   Reproductive/Obstetrics (+) Pregnancy                             Lab Results  Component Value Date   WBC 10.6* 09/27/2015   HGB 9.0* 09/27/2015   HCT 28.1* 09/27/2015   MCV 79.6 09/27/2015   PLT 282 09/27/2015   No results found for: INR, PROTIME   Anesthesia Physical Anesthesia Plan  ASA: II  Anesthesia Plan: Epidural   Post-op Pain Management:    Induction:   Airway Management Planned:   Additional Equipment:   Intra-op Plan:   Post-operative Plan:   Informed Consent: I have reviewed the patients History and Physical, chart, labs and discussed the procedure including the risks, benefits and alternatives for the proposed anesthesia with the patient or authorized representative who has indicated his/her understanding and acceptance.     Plan Discussed with:   Anesthesia Plan Comments:         Anesthesia Quick Evaluation

## 2015-09-29 ENCOUNTER — Encounter (HOSPITAL_COMMUNITY): Payer: Self-pay | Admitting: *Deleted

## 2015-09-29 LAB — CBC
HCT: 25.9 % — ABNORMAL LOW (ref 36.0–46.0)
HEMOGLOBIN: 8.1 g/dL — AB (ref 12.0–15.0)
MCH: 24.9 pg — AB (ref 26.0–34.0)
MCHC: 31.3 g/dL (ref 30.0–36.0)
MCV: 79.7 fL (ref 78.0–100.0)
PLATELETS: 213 10*3/uL (ref 150–400)
RBC: 3.25 MIL/uL — AB (ref 3.87–5.11)
RDW: 15.6 % — ABNORMAL HIGH (ref 11.5–15.5)
WBC: 15.8 10*3/uL — ABNORMAL HIGH (ref 4.0–10.5)

## 2015-09-29 MED ORDER — FERROUS SULFATE 325 (65 FE) MG PO TABS
325.0000 mg | ORAL_TABLET | Freq: Two times a day (BID) | ORAL | Status: DC
  Administered 2015-09-29 – 2015-09-30 (×2): 325 mg via ORAL
  Filled 2015-09-29 (×3): qty 1

## 2015-09-29 NOTE — Progress Notes (Signed)
PPD#1 Pt states that baby was transferred to NICU. Pt is doing well. Lochia-mild VSSAF Hgb 8 IMP/ stable Plan/ Routine care

## 2015-09-29 NOTE — Progress Notes (Signed)
CSW acknowledges NICU admission.    Patient screened out for psychosocial assessment since none of the following apply:  Psychosocial stressors documented in mother or baby's chart  Gestation less than 32 weeks  Code at delivery   Infant with anomalies  Please contact the Clinical Social Worker if specific needs arise, or by MOB's request.       

## 2015-09-29 NOTE — Anesthesia Postprocedure Evaluation (Signed)
Anesthesia Post Note  Patient: Melinda ShieldsCaitlin Phillips  Procedure(s) Performed: * No procedures listed *  Patient location during evaluation: Mother Baby Anesthesia Type: Epidural Level of consciousness: awake, awake and alert and oriented Pain management: pain level controlled Vital Signs Assessment: post-procedure vital signs reviewed and stable Respiratory status: spontaneous breathing, nonlabored ventilation and respiratory function stable Cardiovascular status: stable Postop Assessment: no headache, no backache, patient able to bend at knees, no signs of nausea or vomiting and adequate PO intake Anesthetic complications: no     Last Vitals:  Filed Vitals:   09/28/15 2344 09/29/15 0627  BP: 123/67 116/72  Pulse: 79 68  Temp: 37 C 36.5 C  Resp: 18 16    Last Pain:  Filed Vitals:   09/29/15 0636  PainSc: 0-No pain   Pain Goal: Patients Stated Pain Goal: 3 (09/28/15 1623)               Donnalee CurryMalinova,Raynee Mccasland Hristova

## 2015-09-29 NOTE — Lactation Note (Signed)
This note was copied from a baby's chart. Lactation Consultation Note  Initial visit made with parents in the NICU.  Mom states her friend told her there is no reason to breastfeed if her baby is in the NICU.  Discussed benefits of breast milk for her baby and encouraged her to reconsider.  Mom is willing to begin pumping when she returns to her room.  RN has symphony pump set up and ready.  Lactation services and Providing Breastmilk For Your Baby In NICU given.  Patient Name: Melinda Phillips [id number]'BToday's Date: 09/29/2015 Reason for consult: Initial assessment;NICU baby   Maternal Data    Feeding Feeding Type: Formula Length of feed: 30 min  LATCH Score/Interventions                      Lactation Tools Discussed/Used Initiated by:: RN Date initiated:: 09/29/15   Consult Status Consult Status: Follow-up Date: 09/30/15 Follow-up type: In-patient    Huston FoleyMOULDEN, Aldea Avis S 09/29/2015, 4:05 PM

## 2015-09-29 NOTE — Progress Notes (Signed)
Dr Dareen PianoAnderson notified that mom has a 8.1 hemoglobin.  Orders received

## 2015-09-30 MED ORDER — OXYCODONE-ACETAMINOPHEN 5-325 MG PO TABS: 1.0000 | ORAL_TABLET | ORAL | Status: DC | PRN

## 2015-09-30 MED ORDER — DOCUSATE SODIUM 100 MG PO CAPS: 100.0000 mg | ORAL_CAPSULE | Freq: Two times a day (BID) | ORAL | Status: DC

## 2015-09-30 MED ORDER — IBUPROFEN 600 MG PO TABS: 600.0000 mg | ORAL_TABLET | Freq: Four times a day (QID) | ORAL | Status: DC | PRN

## 2015-09-30 NOTE — Discharge Summary (Signed)
Obstetric Discharge Summary Reason for Admission: onset of labor Prenatal Procedures: ultrasound Intrapartum Procedures: spontaneous vaginal delivery Postpartum Procedures: none Complications-Operative and Postpartum: 2nd degree perineal laceration HEMOGLOBIN  Date Value Ref Range Status  09/29/2015 8.1* 12.0 - 15.0 g/dL Final   HCT  Date Value Ref Range Status  09/29/2015 25.9* 36.0 - 46.0 % Final    Physical Exam:  General: alert, cooperative and appears stated age 24Lochia: appropriate Uterine Fundus: firm Incision: healing well DVT Evaluation: No evidence of DVT seen on physical exam.  Discharge Diagnoses: Term Pregnancy-delivered  Discharge Information: Date: 09/30/2015 Activity: pelvic rest Diet: routine Medications: Ibuprofen, Colace and Percocet Condition: improved Instructions: refer to practice specific booklet Discharge to: home Follow-up Information    Follow up with PINN, Sanjuana MaeWALDA STACIA, MD In 4 weeks.   Specialty:  Obstetrics and Gynecology   Why:  For a postpartum evaluation   Contact information:   [address] [place] [id number] [phone]       Newborn Data: Live born female  Birth Weight: 7 lb 11.8 oz (3510 g) APGAR: 9, 9  Home with mother.  Melinda Lavallie H. 09/30/2015, 8:35 AM

## 2015-10-01 ENCOUNTER — Ambulatory Visit: Payer: Self-pay

## 2015-10-01 NOTE — Lactation Note (Signed)
This note was copied from a baby's chart. Lactation Consultation Note  Patient Name: Melinda Phillips [id number] Date: 10/01/2015 Reason for consult: Follow-up assessment;NICU baby   Follow up with infant in NICU. Infant ready to go to breast for first time. Infant was awake and alert and cueing to feed. Mom noted that she is pumping irregularly and is not getting any milk with pump she has at home ([employer] Years from [employer]) Right breast noted to be soft and left breast noted to be firm and hard. Left breast is painful per mom. Ice placed to breast for 5 minutes and then infant was placed at breast. Infant was fretful and mom's nipple noted to flatten with compression. Infant was unable to grasp nipple and latch. # 20 NS placed to left breast and infant was placed at breast she was fretful and would not stay latched. NS primed with formula and infant latched and was noted to have rhythmic sucking and swallowing. Infant then stayed on breast and nursed intermittently, needing stimulation to maintain suckling. Infant then fell asleep and self detached. Left breast was noted to be slightly softened.   Mom noted that she did not take her entire pump kit home, offered mom another kit with information that I was unsure if insurance would pay for another pump kit, parents agreed to having another pump kit so mom can pump while visiting in NICU. Mom is aware of pumping rooms.  Engorgement treatment handout given to mom. Advised mom to ice left breast for 10-20 minutes prior to pumping. Enc her to BF infant as much as she and mom are able. Enc her to pump every 2-3 hours even after BF until milk is established. Recommended to parents that a Symphony pump would be in mom's best interest to establish her milk supply. Parents are going to let me know if they would like to rent a pump.   Mom did great with supporting infant at breast and massaging breast with feeding. Enc mom to call with questions/concerns  prn.   Maternal Data Does the patient have breastfeeding experience prior to this delivery?: No  Feeding Feeding Type: Breast Fed Length of feed: 20 min  LATCH Score/Interventions Latch: Repeated attempts needed to sustain latch, nipple held in mouth throughout feeding, stimulation needed to elicit sucking reflex. Intervention(s): Breast compression;Breast massage;Assist with latch;Adjust position  Audible Swallowing: A few with stimulation Intervention(s): Skin to skin;Hand expression;Alternate breast massage  Type of Nipple: Everted at rest and after stimulation  Comfort (Breast/Nipple): Filling, red/small blisters or bruises, mild/mod discomfort  Problem noted: Filling Interventions (Filling): Double electric pump  Hold (Positioning): Assistance needed to correctly position infant at breast and maintain latch. Intervention(s): Breastfeeding basics reviewed;Support Pillows;Position options;Skin to skin  LATCH Score: 6  Lactation Tools Discussed/Used Tools: Nipple Shields Nipple shield size: 20 Pump Review: Setup, frequency, and cleaning Initiated by:: Reviewed   Consult Status Consult Status: PRN Follow-up type: Call as needed    Donn Pierini 10/01/2015, 11:48 AM

## 2015-10-04 ENCOUNTER — Ambulatory Visit: Payer: Self-pay

## 2015-10-04 NOTE — Lactation Note (Signed)
This note was copied from a baby's chart. Lactation Consultation Note  Patient Name: Melinda Phillips JYNWG'NToday's Date: 10/04/2015 Reason for consult: Follow-up assessment;NICU baby   Follow up with mom of 6 day old NICU infant. Mom reports her milk supply has increased. She reports she has not put to baby to breast again. She is pumping. Mom reports they are planning to room in tonight. Enc mom to work on  BF and to have RN call to schedule a feeding assessment today or tomorrow. Mom reports infant just recently fed. Follow up tomorrow prior to d/c.    Maternal Data    Feeding Feeding Type: Formula Nipple Type: Slow - flow  LATCH Score/Interventions                      Lactation Tools Discussed/Used     Consult Status Consult Status: Follow-up Date: 10/05/15 Follow-up type: In-patient    Melinda FloodSharon S Matteson Phillips 10/04/2015, 12:25 PM

## 2015-10-05 ENCOUNTER — Ambulatory Visit: Payer: Self-pay

## 2015-10-05 NOTE — Lactation Note (Signed)
This note was copied from a baby's chart. Lactation Consultation Note  Patient Name: Girl Gillian ShieldsCaitlin Bodner ZOXWR'UToday's Date: 10/05/2015 Reason for consult: Follow-up assessment   With this mom and term baby, in NICU, now 177 days old. Mom has been pumping about 3 times a day, unsure about breastfeeding due to pumping at work. Mom encouraged to pump and breast feed with cues, and and that she legally should be given time to pump at work. Mom agreed to latch baby, and baby did latch  With nipple shield filled with EBm, and curved tip syringe with EBM also at breast while baby feeding. Mom's breast was softer after feeding. Mom advised to pump every 3 hours, 8 times a day, at least, and to pump 15-30 minutes, until she stop dripping. Mom seemed receptive to teaching, and was encouraged to use lactation services - o/p consult, phone calls and support groups.    Maternal Data    Feeding Feeding Type: Breast Fed Length of feed: 15 min  LATCH Score/Interventions Latch: Repeated attempts needed to sustain latch, nipple held in mouth throughout feeding, stimulation needed to elicit sucking reflex. (baby latached with 20 nipple shield filled with EBm) Intervention(s): Adjust position;Assist with latch  Audible Swallowing: Spontaneous and intermittent  Type of Nipple: Everted at rest and after stimulation  Comfort (Breast/Nipple): Soft / non-tender     Hold (Positioning): Assistance needed to correctly position infant at breast and maintain latch. Intervention(s): Breastfeeding basics reviewed;Support Pillows;Position options;Skin to skin  LATCH Score: 8  Lactation Tools Discussed/Used Tools: Nipple Shields Nipple shield size: 20   Consult Status Consult Status: Complete Follow-up type: Call as needed    Alfred LevinsLee, Merriam Brandner Anne 10/05/2015, 11:59 AM

## 2015-10-25 NOTE — H&P (Signed)
Melinda Phillips is a 25 y.o. female presenting for regular painful contractions. Maternal Medical History:  Reason for admission: Contractions.   Contractions: Onset was 3-5 hours ago.   Frequency: regular.   Duration is approximately 2 minutes.   Perceived severity is strong.    Fetal activity: Perceived fetal activity is normal.   Last perceived fetal movement was within the past 12 hours.    Prenatal complications: no prenatal complications Prenatal Complications - Diabetes: none.    OB History as of 10/13/15    Gravida Para Term Preterm AB TAB SAB Ectopic Multiple Living   1 1 1       0 1     Past Medical History  Diagnosis Date  . Mononucleosis   . Strep throat 05/2015   Past Surgical History  Procedure Laterality Date  . No past surgeries     Family History: family history includes Diabetes in her maternal grandmother; Heart disease in her paternal grandfather; Hypertension in her father, maternal grandfather, maternal grandmother, mother, paternal grandfather, and paternal grandmother. Social History:  reports that she quit smoking about 2 years ago. Her smoking use included Cigarettes. She has never used smokeless tobacco. She reports that she does not drink alcohol or use illicit drugs.   Prenatal Transfer Tool  Maternal Diabetes: No Genetic Screening: Normal Maternal Ultrasounds/Referrals: Normal Fetal Ultrasounds or other Referrals:  Other:  Maternal Substance Abuse:  No Significant Maternal Medications:  None Significant Maternal Lab Results:  None Other Comments:  None  ROS  Dilation: 10 Effacement (%): 100 Station: +2, +1 Exam by:: Henderson NewcomerStephanie Faulk RN Blood pressure 104/61, pulse 67, temperature 97.5 F (36.4 C), temperature source Oral, resp. rate 20, height 5\' 4"  (1.626 m), weight 71.668 kg (158 lb), last menstrual period 11/07/2014, SpO2 98 %, unknown if currently breastfeeding. Maternal Exam:  Uterine Assessment: Contraction strength is moderate.   Contraction frequency is regular.   Abdomen: Patient reports no abdominal tenderness. Fundal height is 39 cm.   Estimated fetal weight is 2900 grams.   Fetal presentation: vertex  Introitus: Normal vulva. Normal vagina.  Ferning test: not done.  Nitrazine test: not done. Amniotic fluid character: not assessed.  Pelvis: adequate for delivery.   Cervix: Cervix evaluated by digital exam.   6cm / 90%/0  Fetal Exam Fetal Monitor Review: Baseline rate: 135.  Variability: moderate (6-25 bpm).   Pattern: accelerations present and no decelerations.    Fetal State Assessment: Category I - tracings are normal.     Physical Exam  Vitals reviewed. Constitutional: She appears well-developed and well-nourished.  HENT:  Head: Normocephalic and atraumatic.  Eyes: Pupils are equal, round, and reactive to light.  Neck: Normal range of motion.  Cardiovascular: Normal rate, regular rhythm and normal heart sounds.   Respiratory: Effort normal.  GI: Soft.  Genitourinary: Vagina normal.  Musculoskeletal: Normal range of motion.  Neurological: She is alert.  Skin: Skin is warm.  Psychiatric: She has a normal mood and affect.    Prenatal labs: ABO, Rh: --/--/AB POS, AB POS (06/12 2305) Antibody: NEG (06/12 2305) Rubella: Immune (12/05 0000) RPR: Non Reactive (06/12 2305)  HBsAg: Negative (12/05 0000)  HIV: Non-reactive (12/05 0000)  GBS: Negative (05/11 0000)   Assessment/Plan: IUP in labor  Admit to L&D   [name], Glendine Swetz STACIA 10/25/2015, 8:54 PM

## 2016-08-13 ENCOUNTER — Emergency Department (HOSPITAL_COMMUNITY)
Admission: EM | Admit: 2016-08-13 | Discharge: 2016-08-13 | Disposition: A | Discharge status: Home / Self Care | Payer: Commercial Managed Care - HMO | Attending: Emergency Medicine | Admitting: Emergency Medicine

## 2016-08-13 ENCOUNTER — Encounter (HOSPITAL_COMMUNITY): Payer: Self-pay

## 2016-08-13 DIAGNOSIS — T23272A Burn of second degree of left wrist, initial encounter: Secondary | ICD-10-CM | POA: Diagnosis not present

## 2016-08-13 DIAGNOSIS — Y93G3 Activity, cooking and baking: Secondary | ICD-10-CM | POA: Insufficient documentation

## 2016-08-13 DIAGNOSIS — Y999 Unspecified external cause status: Secondary | ICD-10-CM | POA: Insufficient documentation

## 2016-08-13 DIAGNOSIS — X12XXXA Contact with other hot fluids, initial encounter: Secondary | ICD-10-CM | POA: Diagnosis not present

## 2016-08-13 DIAGNOSIS — T23131A Burn of first degree of multiple right fingers (nail), not including thumb, initial encounter: Secondary | ICD-10-CM | POA: Diagnosis not present

## 2016-08-13 DIAGNOSIS — Y9289 Other specified places as the place of occurrence of the external cause: Secondary | ICD-10-CM | POA: Diagnosis not present

## 2016-08-13 DIAGNOSIS — T300 Burn of unspecified body region, unspecified degree: Secondary | ICD-10-CM

## 2016-08-13 DIAGNOSIS — Z87891 Personal history of nicotine dependence: Secondary | ICD-10-CM | POA: Insufficient documentation

## 2016-08-13 DIAGNOSIS — T23202A Burn of second degree of left hand, unspecified site, initial encounter: Secondary | ICD-10-CM | POA: Diagnosis not present

## 2016-08-13 DIAGNOSIS — T22192A Burn of first degree of multiple sites of left shoulder and upper limb, except wrist and hand, initial encounter: Secondary | ICD-10-CM

## 2016-08-13 MED ORDER — HYDROCODONE-ACETAMINOPHEN 5-325 MG PO TABS
1.0000 | ORAL_TABLET | Freq: Once | ORAL | Status: AC
  Administered 2016-08-13: 1 via ORAL
  Filled 2016-08-13: qty 1

## 2016-08-13 MED ORDER — HYDROCODONE-ACETAMINOPHEN 5-325 MG PO TABS
1.0000 | ORAL_TABLET | Freq: Once | ORAL | Status: AC
Start: 2016-08-13 — End: 2016-08-13
  Administered 2016-08-13: 1 via ORAL
  Filled 2016-08-13: qty 1

## 2016-08-13 MED ORDER — HYDROCODONE-ACETAMINOPHEN 5-325 MG PO TABS: 1.0000 | ORAL_TABLET | Freq: Four times a day (QID) | ORAL | 0 refills | Status: DC | PRN
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
## 2016-08-13 NOTE — ED Notes (Signed)
Patient Alert and oriented X4. Stable and ambulatory. Patient verbalized understanding of the discharge instructions.  Patient belongings were taken by the patient.  

## 2016-12-22 ENCOUNTER — Emergency Department (HOSPITAL_COMMUNITY): Payer: 59

## 2016-12-22 ENCOUNTER — Encounter (HOSPITAL_COMMUNITY): Payer: Self-pay | Admitting: Emergency Medicine

## 2016-12-22 ENCOUNTER — Emergency Department (HOSPITAL_COMMUNITY)
Admission: EM | Admit: 2016-12-22 | Discharge: 2016-12-23 | Disposition: A | Discharge status: Home / Self Care | Payer: 59 | Attending: Emergency Medicine | Admitting: Emergency Medicine

## 2016-12-22 DIAGNOSIS — Y999 Unspecified external cause status: Secondary | ICD-10-CM | POA: Diagnosis not present

## 2016-12-22 DIAGNOSIS — S199XXA Unspecified injury of neck, initial encounter: Secondary | ICD-10-CM | POA: Diagnosis present

## 2016-12-22 DIAGNOSIS — Z79899 Other long term (current) drug therapy: Secondary | ICD-10-CM | POA: Diagnosis not present

## 2016-12-22 DIAGNOSIS — Y939 Activity, unspecified: Secondary | ICD-10-CM | POA: Diagnosis not present

## 2016-12-22 DIAGNOSIS — Z87891 Personal history of nicotine dependence: Secondary | ICD-10-CM | POA: Diagnosis not present

## 2016-12-22 DIAGNOSIS — N200 Calculus of kidney: Secondary | ICD-10-CM | POA: Insufficient documentation

## 2016-12-22 DIAGNOSIS — S161XXA Strain of muscle, fascia and tendon at neck level, initial encounter: Secondary | ICD-10-CM | POA: Insufficient documentation

## 2016-12-22 DIAGNOSIS — M25511 Pain in right shoulder: Secondary | ICD-10-CM | POA: Diagnosis not present

## 2016-12-22 DIAGNOSIS — M25562 Pain in left knee: Secondary | ICD-10-CM | POA: Diagnosis not present

## 2016-12-22 DIAGNOSIS — R1030 Lower abdominal pain, unspecified: Secondary | ICD-10-CM | POA: Insufficient documentation

## 2016-12-22 DIAGNOSIS — M79642 Pain in left hand: Secondary | ICD-10-CM | POA: Diagnosis not present

## 2016-12-22 DIAGNOSIS — Y92410 Unspecified street and highway as the place of occurrence of the external cause: Secondary | ICD-10-CM | POA: Diagnosis not present

## 2016-12-22 DIAGNOSIS — M25512 Pain in left shoulder: Secondary | ICD-10-CM | POA: Insufficient documentation

## 2016-12-22 DIAGNOSIS — W2210XA Striking against or struck by unspecified automobile airbag, initial encounter: Secondary | ICD-10-CM | POA: Insufficient documentation

## 2016-12-22 LAB — I-STAT CHEM 8, ED
BUN: 16 mg/dL (ref 6–20)
CHLORIDE: 105 mmol/L (ref 101–111)
Calcium, Ion: 1.17 mmol/L (ref 1.15–1.40)
Creatinine, Ser: 0.8 mg/dL (ref 0.44–1.00)
Glucose, Bld: 93 mg/dL (ref 65–99)
HEMATOCRIT: 38 % (ref 36.0–46.0)
Hemoglobin: 12.9 g/dL (ref 12.0–15.0)
POTASSIUM: 4 mmol/L (ref 3.5–5.1)
SODIUM: 139 mmol/L (ref 135–145)
TCO2: 24 mmol/L (ref 22–32)

## 2016-12-22 LAB — I-STAT BETA HCG BLOOD, ED (MC, WL, AP ONLY): I-stat hCG, quantitative: <5 m[IU]/mL (ref <5)

## 2016-12-22 MED ORDER — HYDROCODONE-ACETAMINOPHEN 5-325 MG PO TABS
2.0000 | ORAL_TABLET | Freq: Once | ORAL | Status: AC
  Administered 2016-12-22: 2 via ORAL
  Filled 2016-12-22: qty 2
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
## 2016-12-23 ENCOUNTER — Encounter (HOSPITAL_COMMUNITY): Payer: Self-pay | Admitting: Radiology

## 2016-12-23 ENCOUNTER — Emergency Department (HOSPITAL_COMMUNITY): Payer: 59

## 2016-12-23 LAB — CBC WITH DIFFERENTIAL/PLATELET
BASOS ABS: 0 10*3/uL (ref 0.0–0.1)
Basophils Relative: 0 %
EOS ABS: 0 10*3/uL (ref 0.0–0.7)
Eosinophils Relative: 0 %
HCT: 37.6 % (ref 36.0–46.0)
Hemoglobin: 13.6 g/dL (ref 12.0–15.0)
Lymphocytes Relative: 18 %
Lymphs Abs: 2.4 10*3/uL (ref 0.7–4.0)
MCH: 32.9 pg (ref 26.0–34.0)
MCHC: 36.2 g/dL — ABNORMAL HIGH (ref 30.0–36.0)
MCV: 91 fL (ref 78.0–100.0)
MONO ABS: 0.6 10*3/uL (ref 0.1–1.0)
Monocytes Relative: 5 %
Neutro Abs: 9.9 10*3/uL — ABNORMAL HIGH (ref 1.7–7.7)
Neutrophils Relative %: 77 %
Platelets: 281 10*3/uL (ref 150–400)
RBC: 4.13 MIL/uL (ref 3.87–5.11)
RDW: 12.6 % (ref 11.5–15.5)
WBC: 13 10*3/uL — AB (ref 4.0–10.5)

## 2016-12-23 MED ORDER — IBUPROFEN 600 MG PO TABS: 600.0000 mg | ORAL_TABLET | Freq: Four times a day (QID) | ORAL | 0 refills | Status: DC | PRN

## 2016-12-23 MED ORDER — IOPAMIDOL (ISOVUE-300) INJECTION 61%
100.0000 mL | Freq: Once | INTRAVENOUS | Status: AC | PRN
  Administered 2016-12-23: 100 mL via INTRAVENOUS

## 2016-12-23 MED ORDER — IOPAMIDOL (ISOVUE-300) INJECTION 61%
INTRAVENOUS | Status: AC
  Filled 2016-12-23: qty 100

## 2016-12-23 MED ORDER — CYCLOBENZAPRINE HCL 10 MG PO TABS
10.0000 mg | ORAL_TABLET | Freq: Two times a day (BID) | ORAL | 0 refills | Status: DC | PRN
Start: 2016-12-23 — End: 2018-07-01

## 2017-04-12 ENCOUNTER — Encounter (HOSPITAL_COMMUNITY): Payer: Self-pay | Admitting: Emergency Medicine

## 2017-04-12 ENCOUNTER — Other Ambulatory Visit: Payer: Self-pay

## 2017-04-12 ENCOUNTER — Emergency Department (HOSPITAL_COMMUNITY): Payer: 59

## 2017-04-12 ENCOUNTER — Emergency Department (HOSPITAL_COMMUNITY)
Admission: EM | Admit: 2017-04-12 | Discharge: 2017-04-13 | Disposition: A | Discharge status: Home / Self Care | Payer: Self-pay | Attending: Emergency Medicine | Admitting: Emergency Medicine

## 2017-04-12 DIAGNOSIS — R109 Unspecified abdominal pain: Secondary | ICD-10-CM

## 2017-04-12 DIAGNOSIS — Z87442 Personal history of urinary calculi: Secondary | ICD-10-CM | POA: Insufficient documentation

## 2017-04-12 DIAGNOSIS — Z79899 Other long term (current) drug therapy: Secondary | ICD-10-CM | POA: Insufficient documentation

## 2017-04-12 DIAGNOSIS — Z87891 Personal history of nicotine dependence: Secondary | ICD-10-CM | POA: Insufficient documentation

## 2017-04-12 DIAGNOSIS — K529 Noninfective gastroenteritis and colitis, unspecified: Secondary | ICD-10-CM | POA: Insufficient documentation

## 2017-04-12 LAB — COMPREHENSIVE METABOLIC PANEL
ALBUMIN: 4.4 g/dL (ref 3.5–5.0)
ALT: 18 U/L (ref 14–54)
AST: 20 U/L (ref 15–41)
Alkaline Phosphatase: 72 U/L (ref 38–126)
Anion gap: 8 (ref 5–15)
BUN: 9 mg/dL (ref 6–20)
CHLORIDE: 106 mmol/L (ref 101–111)
CO2: 24 mmol/L (ref 22–32)
Calcium: 9.3 mg/dL (ref 8.9–10.3)
Creatinine, Ser: 0.73 mg/dL (ref 0.44–1.00)
GFR calc Af Amer: >60 mL/min (ref >60)
Glucose, Bld: 77 mg/dL (ref 65–99)
POTASSIUM: 3.5 mmol/L (ref 3.5–5.1)
Sodium: 138 mmol/L (ref 135–145)
Total Bilirubin: 1.1 mg/dL (ref 0.3–1.2)
Total Protein: 7.3 g/dL (ref 6.5–8.1)

## 2017-04-12 LAB — I-STAT BETA HCG BLOOD, ED (MC, WL, AP ONLY): I-stat hCG, quantitative: <5 m[IU]/mL (ref <5)

## 2017-04-12 LAB — CBC
HEMATOCRIT: 41.6 % (ref 36.0–46.0)
Hemoglobin: 15.1 g/dL — ABNORMAL HIGH (ref 12.0–15.0)
MCH: 33.7 pg (ref 26.0–34.0)
MCHC: 36.3 g/dL — ABNORMAL HIGH (ref 30.0–36.0)
MCV: 92.9 fL (ref 78.0–100.0)
Platelets: 279 10*3/uL (ref 150–400)
RBC: 4.48 MIL/uL (ref 3.87–5.11)
RDW: 12.7 % (ref 11.5–15.5)
WBC: 9.2 10*3/uL (ref 4.0–10.5)

## 2017-04-12 LAB — URINALYSIS, ROUTINE W REFLEX MICROSCOPIC
Bilirubin Urine: NEGATIVE
GLUCOSE, UA: NEGATIVE mg/dL
Hgb urine dipstick: NEGATIVE
KETONES UR: NEGATIVE mg/dL
Leukocytes, UA: NEGATIVE
Nitrite: NEGATIVE
PH: 5 (ref 5.0–8.0)
Protein, ur: NEGATIVE mg/dL
Specific Gravity, Urine: 1.021 (ref 1.005–1.030)

## 2017-04-12 LAB — LIPASE, BLOOD: LIPASE: 32 U/L (ref 11–51)

## 2017-04-12 MED ORDER — ONDANSETRON HCL 4 MG/2ML IJ SOLN
4.0000 mg | Freq: Once | INTRAMUSCULAR | Status: DC
  Filled 2017-04-12: qty 2

## 2017-04-12 MED ORDER — SODIUM CHLORIDE 0.9 % IV BOLUS (SEPSIS): 1000.0000 mL | Freq: Once | INTRAVENOUS | Status: DC

## 2017-04-12 NOTE — ED Notes (Signed)
Patient transported to CT 

## 2017-04-12 NOTE — ED Triage Notes (Signed)
Pt reports L lower abd pain radiating into back, N/V/D onset 0300.

## 2017-04-12 NOTE — ED Notes (Signed)
Attempted iv x1. unsuccessful ?

## 2017-04-13 MED ORDER — PROMETHAZINE HCL 25 MG PO TABS: 25.0000 mg | ORAL_TABLET | Freq: Four times a day (QID) | ORAL | 0 refills | Status: DC | PRN

## 2017-04-13 NOTE — ED Provider Notes (Signed)
[facility] EMERGENCY DEPARTMENT Provider Note   CSN: [id number] Arrival date & time: 04/12/17  1918     History   Chief Complaint Chief Complaint  Patient presents with  . Abdominal Pain    HPI Melinda Phillips is a 26 y.o. female.  HPI Patient presents to the emergency department with left-sided abdominal discomfort that started this morning around 3 AM.  The patient states that she has had nausea vomiting diarrhea.  She states that she did not take any medications prior to arrival.  She states the pain did seem to wax and wane some.  The patient denies chest pain, shortness of breath, headache,blurred vision, neck pain, fever, cough, weakness, numbness, dizziness, anorexia, edema,  rash, back pain, dysuria, hematemesis, bloody stool, near syncope, or syncope. Past Medical History:  Diagnosis Date  . Kidney stone   . Mononucleosis   . Strep throat 05/2015    Patient Active Problem List   Diagnosis Date Noted  . Normal vaginal delivery 09/28/2015  . Active labor 09/27/2015    Past Surgical History:  Procedure Laterality Date  . NO PAST SURGERIES      OB History    Gravida Para Term Preterm AB Living   1 1 1     1    SAB TAB Ectopic Multiple Live Births         0 1       Home Medications    Prior to Admission medications   Medication Sig Start Date End Date Taking? Authorizing Provider  bismuth subsalicylate (PEPTO BISMOL) 262 MG/15ML suspension Take 30 mLs by mouth every 6 (six) hours as needed for indigestion.   Yes [provider]  ibuprofen (ADVIL,MOTRIN) 200 MG tablet Take 400 mg by mouth every 6 (six) hours as needed for mild pain.   Yes [provider]  levonorgestrel-ethinyl estradiol (AVIANE,ALESSE,LESSINA) 0.1-20 MG-MCG tablet Take 1 tablet by mouth daily.   Yes [provider]  cyclobenzaprine (FLEXERIL) 10 MG tablet Take 1 tablet (10 mg total) by mouth 2 (two) times daily as needed for muscle  spasms. Patient not taking: Reported on 04/12/2017 12/23/16   Roxy Horseman, PA-C  docusate sodium (COLACE) 100 MG capsule Take 1 capsule (100 mg total) by mouth 2 (two) times daily. Patient not taking: Reported on 04/12/2017 09/30/15   Waynard Reeds, MD  ibuprofen (ADVIL,MOTRIN) 600 MG tablet Take 1 tablet (600 mg total) by mouth every 6 (six) hours as needed. Patient not taking: Reported on 04/12/2017 12/23/16   Roxy Horseman, PA-C    Family History Family History  Problem Relation Age of Onset  . Hypertension Mother   . Hypertension Father   . Hypertension Maternal Grandmother   . Diabetes Maternal Grandmother   . Hypertension Maternal Grandfather   . Hypertension Paternal Grandmother   . Hypertension Paternal Grandfather   . Heart disease Paternal Grandfather     Social History Social History   Tobacco Use  . Smoking status: Former Smoker    Types: Cigarettes    Last attempt to quit: 09/26/2013    Years since quitting: 3.5  . Smokeless tobacco: Never Used  Substance Use Topics  . Alcohol use: No  . Drug use: No     Allergies   Bee venom and Cinnamon   Review of Systems Review of Systems All other systems negative except as documented in the HPI. All pertinent positives and negatives as reviewed in the HPI.  Physical Exam Updated Vital Signs BP 118/78  Pulse 76   Temp 98 F (36.7 C) (Oral)   Resp 16   Ht 5\' 4"  (1.626 m)   Wt 72.6 kg (160 lb)   LMP 03/29/2017 (Approximate)   SpO2 99%   BMI 27.46 kg/m   Physical Exam  Constitutional: She is oriented to person, place, and time. She appears well-developed and well-nourished. No distress.  HENT:  Head: Normocephalic and atraumatic.  Mouth/Throat: Oropharynx is clear and moist.  Eyes: Pupils are equal, round, and reactive to light.  Neck: Normal range of motion. Neck supple.  Cardiovascular: Normal rate, regular rhythm and normal heart sounds. Exam reveals no gallop and no friction rub.  No murmur  heard. Pulmonary/Chest: Effort normal and breath sounds normal. No respiratory distress. She has no wheezes.  Abdominal: Soft. Bowel sounds are normal. She exhibits no distension. There is no tenderness.  Neurological: She is alert and oriented to person, place, and time. She exhibits normal muscle tone. Coordination normal.  Skin: Skin is warm and dry. Capillary refill takes less than 2 seconds. No rash noted. No erythema.  Psychiatric: She has a normal mood and affect. Her behavior is normal.  Nursing note and vitals reviewed.    ED Treatments / Results  Labs (all labs ordered are listed, but only abnormal results are displayed) Labs Reviewed  CBC - Abnormal; Notable for the following components:      Result Value   Hemoglobin 15.1 (*)    MCHC 36.3 (*)    All other components within normal limits  URINALYSIS, ROUTINE W REFLEX MICROSCOPIC - Abnormal; Notable for the following components:   APPearance HAZY (*)    All other components within normal limits  LIPASE, BLOOD  COMPREHENSIVE METABOLIC PANEL  I-STAT BETA HCG BLOOD, ED ([facility], [facility], [facility] ONLY)    EKG  EKG Interpretation None       Radiology Ct Renal Stone Study  Result Date: 04/12/2017 CLINICAL DATA:  26 year old female with flank pain, nausea vomiting. EXAM: CT ABDOMEN AND PELVIS WITHOUT CONTRAST TECHNIQUE: Multidetector CT imaging of the abdomen and pelvis was performed following the standard protocol without IV contrast. COMPARISON:  Abdominal CT dated 12/23/2016 FINDINGS: Evaluation of this exam is limited in the absence of intravenous contrast. Lower chest: The visualized lung bases are clear. No intra-abdominal free air or free fluid. Hepatobiliary: Mild fatty infiltration of the liver. No intrahepatic biliary ductal dilatation. The gallbladder is unremarkable. Pancreas: Unremarkable. No pancreatic ductal dilatation or surrounding inflammatory changes. Spleen: Normal in size without focal abnormality. Adrenals/Urinary  Tract: The adrenal glands are unremarkable. There are punctate nonobstructing bilateral renal calculi. There is no hydronephrosis on either side. The visualized ureters and urinary bladder appear unremarkable. Stomach/Bowel: There is no bowel obstruction or active inflammation. Normal appendix. Vascular/Lymphatic: The abdominal aorta and IVC are grossly unremarkable on this noncontrast CT. No portal venous gas. There is no adenopathy. Reproductive: The uterus and ovaries are grossly unremarkable. No pelvic mass. Other: Small fat containing umbilical hernia. Musculoskeletal: No acute or significant osseous findings. IMPRESSION: 1. Punctate nonobstructing bilateral renal calculi. No hydronephrosis. 2. Probable mild fatty liver. Electronically Signed   By: Elgie CollardArash  Radparvar M.D.   On: 04/12/2017 23:17    Procedures Procedures (including critical care time)  Medications Ordered in ED Medications  sodium chloride 0.9 % bolus 1,000 mL (not administered)  ondansetron (ZOFRAN) injection 4 mg (not administered)     Initial Impression / Assessment and Plan / ED Course  I have reviewed the triage vital signs and  the nursing notes.  Pertinent labs & imaging results that were available during my care of the patient were reviewed by me and considered in my medical decision making (see chart for details).     Patient most likely has a gastroenteritis based on her HPI and physical exam findings along with the CT scan I did advise the patient that her condition could worsen.  Have advised to return here for any worsening in her condition.  The patient would like to forego any IV fluids at this time.  She states that she will go home and drink oral fluids.  Patient will be discharged home with patient agrees the plan and all questions were answered  Final Clinical Impressions(s) / ED Diagnoses   Final diagnoses:  None    ED Discharge Orders    None       Charlestine NightLawyer, Mikhayla Phillis, PA-C 04/13/17 0018     Jacalyn LefevreHaviland, Julie, MD 04/13/17 [phone]

## 2017-04-13 NOTE — Discharge Instructions (Signed)
Return here as needed. Slowly increase your fluid intake. Rest as much as possible. °

## 2017-04-13 NOTE — ED Notes (Signed)
PT states understanding of care given, follow up care, and medication prescribed. PT ambulated from ED to car with a steady gait. 
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
## 2018-06-03 ENCOUNTER — Emergency Department (HOSPITAL_COMMUNITY)
Admission: EM | Admit: 2018-06-03 | Discharge: 2018-06-04 | Disposition: A | Discharge status: Home / Self Care | Payer: 59 | Attending: Emergency Medicine | Admitting: Emergency Medicine

## 2018-06-03 ENCOUNTER — Other Ambulatory Visit: Payer: Self-pay

## 2018-06-03 ENCOUNTER — Encounter (HOSPITAL_COMMUNITY): Payer: Self-pay | Admitting: Emergency Medicine

## 2018-06-03 DIAGNOSIS — Z87891 Personal history of nicotine dependence: Secondary | ICD-10-CM | POA: Diagnosis not present

## 2018-06-03 DIAGNOSIS — K429 Umbilical hernia without obstruction or gangrene: Secondary | ICD-10-CM | POA: Insufficient documentation

## 2018-06-03 DIAGNOSIS — R1033 Periumbilical pain: Secondary | ICD-10-CM | POA: Diagnosis present

## 2018-06-03 DIAGNOSIS — N3000 Acute cystitis without hematuria: Secondary | ICD-10-CM | POA: Insufficient documentation

## 2018-06-03 DIAGNOSIS — Z793 Long term (current) use of hormonal contraceptives: Secondary | ICD-10-CM | POA: Insufficient documentation

## 2018-06-03 HISTORY — DX: Umbilical hernia without obstruction or gangrene: K42.9

## 2018-06-03 LAB — CBC
HCT: 40 % (ref 36.0–46.0)
Hemoglobin: 14 g/dL (ref 12.0–15.0)
MCH: 33.4 pg (ref 26.0–34.0)
MCHC: 35 g/dL (ref 30.0–36.0)
MCV: 95.5 fL (ref 80.0–100.0)
Platelets: 296 10*3/uL (ref 150–400)
RBC: 4.19 MIL/uL (ref 3.87–5.11)
RDW: 12 % (ref 11.5–15.5)
WBC: 11.2 10*3/uL — ABNORMAL HIGH (ref 4.0–10.5)
nRBC: 0 % (ref 0.0–0.2)

## 2018-06-03 LAB — COMPREHENSIVE METABOLIC PANEL
ALT: 17 U/L (ref 0–44)
AST: 34 U/L (ref 15–41)
Albumin: 4.2 g/dL (ref 3.5–5.0)
Alkaline Phosphatase: 61 U/L (ref 38–126)
Anion gap: 8 (ref 5–15)
BUN: 13 mg/dL (ref 6–20)
CO2: 24 mmol/L (ref 22–32)
CREATININE: 0.84 mg/dL (ref 0.44–1.00)
Calcium: 8.9 mg/dL (ref 8.9–10.3)
Chloride: 104 mmol/L (ref 98–111)
GFR calc Af Amer: >60 mL/min (ref >60)
GFR calc non Af Amer: >60 mL/min (ref >60)
GLUCOSE: 90 mg/dL (ref 70–99)
Potassium: 4.4 mmol/L (ref 3.5–5.1)
Sodium: 136 mmol/L (ref 135–145)
Total Bilirubin: 1.6 mg/dL — ABNORMAL HIGH (ref 0.3–1.2)
Total Protein: 6.9 g/dL (ref 6.5–8.1)

## 2018-06-03 LAB — I-STAT BETA HCG BLOOD, ED (MC, WL, AP ONLY): I-stat hCG, quantitative: <5 m[IU]/mL (ref <5)

## 2018-06-03 LAB — LIPASE, BLOOD: Lipase: 35 U/L (ref 11–51)

## 2018-06-03 MED ORDER — SODIUM CHLORIDE 0.9% FLUSH
3.0000 mL | Freq: Once | INTRAVENOUS | Status: AC
  Administered 2018-06-04: 3 mL via INTRAVENOUS

## 2018-06-03 NOTE — ED Triage Notes (Signed)
Reports umbilical hernia x 2 1/2 years.  Saw surgeon but declined surgery at the time.  Pain over the last 2 months.  Sharp R sided umbilical pain that radiates to L side of umbilicus today.

## 2018-06-04 ENCOUNTER — Emergency Department (HOSPITAL_COMMUNITY): Payer: 59

## 2018-06-04 LAB — URINALYSIS, ROUTINE W REFLEX MICROSCOPIC
Bilirubin Urine: NEGATIVE
GLUCOSE, UA: NEGATIVE mg/dL
Hgb urine dipstick: NEGATIVE
Ketones, ur: NEGATIVE mg/dL
Nitrite: NEGATIVE
Protein, ur: NEGATIVE mg/dL
Specific Gravity, Urine: 1.027 (ref 1.005–1.030)
pH: 6 (ref 5.0–8.0)

## 2018-06-04 MED ORDER — ONDANSETRON HCL 4 MG/2ML IJ SOLN
4.0000 mg | Freq: Once | INTRAMUSCULAR | Status: AC
  Administered 2018-06-04: 4 mg via INTRAVENOUS
  Filled 2018-06-04: qty 2

## 2018-06-04 MED ORDER — MORPHINE SULFATE (PF) 4 MG/ML IV SOLN
4.0000 mg | Freq: Once | INTRAVENOUS | Status: AC
  Administered 2018-06-04: 4 mg via INTRAVENOUS
  Filled 2018-06-04: qty 1

## 2018-06-04 MED ORDER — CEPHALEXIN 500 MG PO CAPS: 500.0000 mg | ORAL_CAPSULE | Freq: Two times a day (BID) | ORAL | 0 refills | Status: DC

## 2018-06-04 MED ORDER — CEPHALEXIN 500 MG PO CAPS: 500.0000 mg | ORAL_CAPSULE | Freq: Four times a day (QID) | ORAL | 0 refills | Status: DC

## 2018-06-04 MED ORDER — IOHEXOL 300 MG/ML  SOLN
100.0000 mL | Freq: Once | INTRAMUSCULAR | Status: AC | PRN
  Administered 2018-06-04: 100 mL via INTRAVENOUS

## 2018-06-04 NOTE — ED Provider Notes (Signed)
[facility] EMERGENCY DEPARTMENT Provider Note   CSN: [id number] Arrival date & time: 06/03/18  1824    History   Chief Complaint Chief Complaint  Patient presents with  . Abdominal Pain    HPI Melinda Phillips is a 28 y.o. female.     Patient presents to the emergency department with a chief complaint of periumbilical pain.  She states that she has a known umbilical hernia, but that it has never caused her any problems until the past couple of days.  She states that when she bent over today she felt a bulge around her bellybutton and has had significant pain since then.  Pain is moderate.  Denies any nausea, vomiting, or diarrhea.  Denies any dysuria or vaginal discharge.  She has not taken anything for the symptoms.  States that she was evaluated by a general surgeon after having had her first child, but elected not to undergo any intervention for her hernia at that time.  The history is provided by the patient. No language interpreter was used.    Past Medical History:  Diagnosis Date  . Kidney stone   . Mononucleosis   . Strep throat 05/2015  . Umbilical hernia     Patient Active Problem List   Diagnosis Date Noted  . Normal vaginal delivery 09/28/2015  . Active labor 09/27/2015    Past Surgical History:  Procedure Laterality Date  . NO PAST SURGERIES       OB History    Gravida  1   Para  1   Term  1   Preterm      AB      Living  1     SAB      TAB      Ectopic      Multiple  0   Live Births  1            Home Medications    Prior to Admission medications   Medication Sig Start Date End Date Taking? Authorizing Provider  bismuth subsalicylate (PEPTO BISMOL) 262 MG/15ML suspension Take 30 mLs by mouth every 6 (six) hours as needed for indigestion.    [provider]  cyclobenzaprine (FLEXERIL) 10 MG tablet Take 1 tablet (10 mg total) by mouth 2 (two) times daily as needed for muscle spasms. Patient not  taking: Reported on 04/12/2017 12/23/16   Roxy Horseman, PA-C  docusate sodium (COLACE) 100 MG capsule Take 1 capsule (100 mg total) by mouth 2 (two) times daily. Patient not taking: Reported on 04/12/2017 09/30/15   Waynard Reeds, MD  ibuprofen (ADVIL,MOTRIN) 200 MG tablet Take 400 mg by mouth every 6 (six) hours as needed for mild pain.    [provider]  ibuprofen (ADVIL,MOTRIN) 600 MG tablet Take 1 tablet (600 mg total) by mouth every 6 (six) hours as needed. Patient not taking: Reported on 04/12/2017 12/23/16   Roxy Horseman, PA-C  levonorgestrel-ethinyl estradiol (AVIANE,ALESSE,LESSINA) 0.1-20 MG-MCG tablet Take 1 tablet by mouth daily.    [provider]  promethazine (PHENERGAN) 25 MG tablet Take 1 tablet (25 mg total) by mouth every 6 (six) hours as needed for nausea or vomiting. 04/13/17   Charlestine Night, PA-C    Family History Family History  Problem Relation Age of Onset  . Hypertension Mother   . Hypertension Father   . Hypertension Maternal Grandmother   . Diabetes Maternal Grandmother   . Hypertension Maternal Grandfather   . Hypertension Paternal Grandmother   .  Hypertension Paternal Grandfather   . Heart disease Paternal Grandfather     Social History Social History   Tobacco Use  . Smoking status: Former Smoker    Types: Cigarettes    Last attempt to quit: 09/26/2013    Years since quitting: 4.6  . Smokeless tobacco: Never Used  Substance Use Topics  . Alcohol use: No  . Drug use: No     Allergies   Bee venom and Cinnamon   Review of Systems Review of Systems  All other systems reviewed and are negative.    Physical Exam Updated Vital Signs BP 127/73 (BP Location: Right Arm)   Pulse 72   Temp 97.7 F (36.5 C) (Oral)   Resp 18   LMP 05/20/2018   SpO2 97%   Physical Exam Vitals signs and nursing note reviewed.  Constitutional:      Appearance: She is well-developed.  HENT:     Head: Normocephalic and atraumatic.   Eyes:     Conjunctiva/sclera: Conjunctivae normal.     Pupils: Pupils are equal, round, and reactive to light.  Neck:     Musculoskeletal: Normal range of motion and neck supple.  Cardiovascular:     Rate and Rhythm: Normal rate and regular rhythm.     Heart sounds: No murmur. No friction rub. No gallop.   Pulmonary:     Effort: Pulmonary effort is normal. No respiratory distress.     Breath sounds: Normal breath sounds. No wheezing or rales.  Chest:     Chest wall: No tenderness.  Abdominal:     General: Bowel sounds are normal. There is no distension.     Palpations: Abdomen is soft. There is no mass.     Tenderness: There is abdominal tenderness in the periumbilical area. There is no guarding or rebound.  Musculoskeletal: Normal range of motion.        General: No tenderness.  Skin:    General: Skin is warm and dry.  Neurological:     Mental Status: She is alert and oriented to person, place, and time.  Psychiatric:        Behavior: Behavior normal.        Thought Content: Thought content normal.        Judgment: Judgment normal.      ED Treatments / Results  Labs (all labs ordered are listed, but only abnormal results are displayed) Labs Reviewed  COMPREHENSIVE METABOLIC PANEL - Abnormal; Notable for the following components:      Result Value   Total Bilirubin 1.6 (*)    All other components within normal limits  CBC - Abnormal; Notable for the following components:   WBC 11.2 (*)    All other components within normal limits  URINALYSIS, ROUTINE W REFLEX MICROSCOPIC - Abnormal; Notable for the following components:   APPearance CLOUDY (*)    Leukocytes,Ua MODERATE (*)    Bacteria, UA RARE (*)    All other components within normal limits  LIPASE, BLOOD  I-STAT BETA HCG BLOOD, ED ([facility], [facility], [facility] ONLY)    EKG None  Radiology Ct Abdomen Pelvis W Contrast  Result Date: 06/04/2018 CLINICAL DATA:  28 year old female with periumbilical abdominal pain. EXAM: CT  ABDOMEN AND PELVIS WITH CONTRAST TECHNIQUE: Multidetector CT imaging of the abdomen and pelvis was performed using the standard protocol following bolus administration of intravenous contrast. CONTRAST:  100mL OMNIPAQUE IOHEXOL 300 MG/ML  SOLN COMPARISON:  CT Abdomen and Pelvis 04/12/2017 and earlier. FINDINGS: Lower chest: Negative.  Hepatobiliary: Negative liver and gallbladder. Pancreas: Negative. Spleen: Splenic size at the upper limits of normal is stable. Adrenals/Urinary Tract: Normal adrenal glands. Symmetric and normal renal enhancement. Early renal contrast excretion. Decompressed and normal proximal ureters. Diminutive and unremarkable urinary bladder. Stomach/Bowel: Decompressed and negative rectum. Mildly redundant sigmoid. Negative descending colon. Mild retained stool in the right colon and transverse colon. Normal retrocecal appendix (series 3, image 47). Negative terminal ileum. There is a subtle fat containing umbilical hernia which is mildly larger than in 2018 (series 3, image 55), but does not appear inflamed. No dilated small bowel. Negative stomach and duodenum. No free air, free fluid. Vascular/Lymphatic: Major arterial structures appear normal. Portal venous system is patent. No lymphadenopathy. Reproductive: Within normal limits. Other: No pelvic free fluid. Musculoskeletal: Negative. IMPRESSION: 1. Normal appendix. No acute or inflammatory process identified in the abdomen or pelvis. 2. Subtle fat containing umbilical hernia is mildly larger than in 2018 but does not appear inflamed. Electronically Signed   By: Odessa Fleming M.D.   On: 06/04/2018 04:12    Procedures Procedures (including critical care time)  Medications Ordered in ED Medications  sodium chloride flush (NS) 0.9 % injection 3 mL (has no administration in time range)  morphine 4 MG/ML injection 4 mg (has no administration in time range)  ondansetron (ZOFRAN) injection 4 mg (has no administration in time range)      Initial Impression / Assessment and Plan / ED Course  I have reviewed the triage vital signs and the nursing notes.  Pertinent labs & imaging results that were available during my care of the patient were reviewed by me and considered in my medical decision making (see chart for details).       Patient with periumbilical pain times couple of days.  Does have an umbilical hernia, which she is concerned about.  She has suprapubic tenderness on exam.  Laboratory work-up shows leukocytosis as well as urinalysis which shows questionable infection, but could be contamination.  CT today shows no evidence of strangulation or incarceration of her hernia.  I have referred her back to [facility] surgery for further evaluation.  Will cover with Keflex for possible UTI.  Patient is in no acute distress.  She is stable ready for discharge.   Final Clinical Impressions(s) / ED Diagnoses   Final diagnoses:  Acute cystitis without hematuria  Umbilical hernia without obstruction and without gangrene    ED Discharge Orders         Ordered    cephALEXin (KEFLEX) 500 MG capsule  4 times daily,   Status:  Discontinued     06/04/18 0432    cephALEXin (KEFLEX) 500 MG capsule  2 times daily     06/04/18 0433           Roxy Horseman, PA-C 06/04/18 0457    Ward, Layla Maw, DO 06/04/18 [phone]

## 2018-06-04 NOTE — ED Notes (Signed)
Patient verbalizes understanding of medications and discharge instructions. No further questions at this time. VSS and patient ambulatory at discharge.   

## 2018-06-25 ENCOUNTER — Ambulatory Visit: Payer: Self-pay | Admitting: General Surgery

## 2018-06-27 NOTE — Patient Instructions (Addendum)
[facility]    Your procedure is scheduled on: 07-02-2018  Report to [facility] Main  Entrance  Report to admitting at 1130  AM    Call this number if you have problems the morning of surgery [phone]   Remember:  BRUSH YOUR TEETH MORNING OF SURGERY AND RINSE YOUR MOUTH OUT, NO CHEWING GUM CANDY OR MINTS.   NO SOLID FOOD AFTER MIDNIGHT THE NIGHT PRIOR TO SURGERY. NOTHING BY MOUTH EXCEPT CLEAR LIQUIDS UNTIL 3 HOURS PRIOR TO SCHEULED SURGERY. PLEASE FINISH ENSURE DRINK PER SURGEON ORDER 3 HOURS PRIOR TO SCHEDULED SURGERY TIME WHICH NEEDS TO BE COMPLETED AT 1030   CLEAR LIQUID DIET   Foods Allowed                                                                     Foods Excluded  Coffee and tea, regular and decaf                             liquids that you cannot  Plain Jell-O in any flavor                                             see through such as: Fruit ices (not with fruit pulp)                                     milk, soups, orange juice  Iced Popsicles                                    All solid food Carbonated beverages, regular and diet                                    Cranberry, grape and apple juices Sports drinks like Gatorade Lightly seasoned clear broth or consume(fat free) Sugar, honey syrup  Sample Menu Breakfast                                Lunch                                     Supper Cranberry juice                    Beef broth                            Chicken broth Jell-O  Grape juice                           Apple juice Coffee or tea                        Jell-O                                      Popsicle                                                Coffee or tea                        Coffee or tea  _____________________________________________________________________     Take these medicines the morning of surgery with A SIP OF WATER: none                          You may not have any metal on your body including hair pins and              piercings  Do not wear jewelry, make-up, lotions, powders or perfumes, deodorant             Do not wear nail polish.  Do not shave  48 hours prior to surgery.              Men may shave face and neck.   Do not bring valuables to the hospital. [facility] IS NOT             RESPONSIBLE   FOR VALUABLES.  Contacts, dentures or bridgework may not be worn into surgery.  Leave suitcase in the car. After surgery it may be brought to your room.     Patients discharged the day of surgery will not be allowed to drive home. IF YOU ARE HAVING SURGERY AND GOING HOME THE SAME DAY, YOU MUST HAVE AN ADULT TO DRIVE YOU HOME AND BE WITH YOU FOR 24 HOURS. YOU MAY GO HOME BY TAXI OR UBER OR ORTHERWISE, BUT AN ADULT MUST ACCOMPANY YOU HOME AND STAY WITH YOU FOR 24 HOURS.  Name and phone number of your driver:  Special Instructions: N/A              Please read over the following fact sheets you were given: _____________________________________________________________________   [facility] - Preparing for Surgery Before surgery, you can play an important role.  Because skin is not sterile, your skin needs to be as free of germs as possible.  You can reduce the number of germs on your skin by washing with CHG (chlorahexidine gluconate) soap before surgery.  CHG is an antiseptic cleaner which kills germs and bonds with the skin to continue killing germs even after washing. Please DO NOT use if you have an allergy to CHG or antibacterial soaps.  If your skin becomes reddened/irritated stop using the CHG and inform your nurse when you arrive at Short Stay. Do not shave (including legs and underarms) for at least 48 hours prior to the first CHG shower.  You may shave your face/neck. Please follow these instructions carefully:  1.  Shower  with CHG Soap the night before surgery and the  morning of Surgery.  2.  If you choose to wash your  hair, wash your hair first as usual with your  normal  shampoo.  3.  After you shampoo, rinse your hair and body thoroughly to remove the  shampoo.                           4.  Use CHG as you would any other liquid soap.  You can apply chg directly  to the skin and wash                       Gently with a scrungie or clean washcloth.  5.  Apply the CHG Soap to your body ONLY FROM THE NECK DOWN.   Do not use on face/ open                           Wound or open sores. Avoid contact with eyes, ears mouth and genitals (private parts).                       Wash face,  Genitals (private parts) with your normal soap.             6.  Wash thoroughly, paying special attention to the area where your surgery  will be performed.  7.  Thoroughly rinse your body with warm water from the neck down.  8.  DO NOT shower/wash with your normal soap after using and rinsing off  the CHG Soap.                9.  Pat yourself dry with a clean towel.            10.  Wear clean pajamas.            11.  Place clean sheets on your bed the night of your first shower and do not  sleep with pets. Day of Surgery : Do not apply any lotions/deodorants the morning of surgery.  Please wear clean clothes to the hospital/surgery center.  FAILURE TO FOLLOW THESE INSTRUCTIONS MAY RESULT IN THE CANCELLATION OF YOUR SURGERY PATIENT SIGNATURE_________________________________  NURSE SIGNATURE__________________________________  ________________________________________________________________________

## 2018-07-01 ENCOUNTER — Other Ambulatory Visit: Payer: Self-pay

## 2018-07-01 ENCOUNTER — Encounter (HOSPITAL_COMMUNITY)
Admission: RE | Admit: 2018-07-01 | Discharge: 2018-07-01 | Disposition: A | Discharge status: Home / Self Care | Payer: 59 | Source: Ambulatory Visit | Attending: General Surgery | Admitting: General Surgery

## 2018-07-01 ENCOUNTER — Encounter (HOSPITAL_COMMUNITY): Payer: Self-pay

## 2018-07-01 DIAGNOSIS — K429 Umbilical hernia without obstruction or gangrene: Secondary | ICD-10-CM | POA: Diagnosis present

## 2018-07-01 DIAGNOSIS — Z01812 Encounter for preprocedural laboratory examination: Secondary | ICD-10-CM

## 2018-07-01 DIAGNOSIS — K439 Ventral hernia without obstruction or gangrene: Secondary | ICD-10-CM | POA: Diagnosis not present

## 2018-07-01 DIAGNOSIS — Z87891 Personal history of nicotine dependence: Secondary | ICD-10-CM | POA: Diagnosis not present

## 2018-07-01 HISTORY — DX: Personal history of urinary calculi: Z87.442

## 2018-07-01 LAB — CBC
HCT: 39.9 % (ref 36.0–46.0)
Hemoglobin: 14 g/dL (ref 12.0–15.0)
MCH: 34.1 pg — ABNORMAL HIGH (ref 26.0–34.0)
MCHC: 35.1 g/dL (ref 30.0–36.0)
MCV: 97.1 fL (ref 80.0–100.0)
Platelets: 267 10*3/uL (ref 150–400)
RBC: 4.11 MIL/uL (ref 3.87–5.11)
RDW: 12.6 % (ref 11.5–15.5)
WBC: 8.2 10*3/uL (ref 4.0–10.5)
nRBC: 0 % (ref 0.0–0.2)

## 2018-07-01 LAB — HCG, SERUM, QUALITATIVE: Preg, Serum: NEGATIVE

## 2018-07-02 ENCOUNTER — Other Ambulatory Visit: Payer: Self-pay

## 2018-07-02 ENCOUNTER — Ambulatory Visit (HOSPITAL_COMMUNITY): Payer: 59 | Admitting: Physician Assistant

## 2018-07-02 ENCOUNTER — Encounter (HOSPITAL_COMMUNITY): Payer: Self-pay | Admitting: Emergency Medicine

## 2018-07-02 ENCOUNTER — Ambulatory Visit (HOSPITAL_COMMUNITY): Payer: 59 | Admitting: Certified Registered"

## 2018-07-02 ENCOUNTER — Ambulatory Visit (HOSPITAL_COMMUNITY)
Admission: RE | Admit: 2018-07-02 | Discharge: 2018-07-02 | Disposition: A | Discharge status: Home / Self Care | Payer: 59 | Attending: General Surgery | Admitting: General Surgery

## 2018-07-02 ENCOUNTER — Encounter (HOSPITAL_COMMUNITY)
Admission: RE | Disposition: A | Discharge status: Home / Self Care | Payer: Self-pay | Source: Home / Self Care | Attending: General Surgery

## 2018-07-02 DIAGNOSIS — K439 Ventral hernia without obstruction or gangrene: Secondary | ICD-10-CM | POA: Insufficient documentation

## 2018-07-02 DIAGNOSIS — K429 Umbilical hernia without obstruction or gangrene: Secondary | ICD-10-CM | POA: Insufficient documentation

## 2018-07-02 DIAGNOSIS — Z87891 Personal history of nicotine dependence: Secondary | ICD-10-CM | POA: Insufficient documentation

## 2018-07-02 HISTORY — PX: UMBILICAL HERNIA REPAIR: SHX196

## 2018-07-02 SURGERY — REPAIR, HERNIA, UMBILICAL, LAPAROSCOPIC
Anesthesia: General | Site: Abdomen

## 2018-07-02 MED ORDER — SCOPOLAMINE 1 MG/3DAYS TD PT72
MEDICATED_PATCH | TRANSDERMAL | Status: AC
  Administered 2018-07-02: 1.5 mg via TRANSDERMAL
  Filled 2018-07-02: qty 1

## 2018-07-02 MED ORDER — FENTANYL CITRATE (PF) 100 MCG/2ML IJ SOLN
INTRAMUSCULAR | Status: AC
  Filled 2018-07-02: qty 2

## 2018-07-02 MED ORDER — FENTANYL CITRATE (PF) 100 MCG/2ML IJ SOLN
25.0000 ug | INTRAMUSCULAR | Status: DC | PRN
  Administered 2018-07-02: 50 ug via INTRAVENOUS

## 2018-07-02 MED ORDER — MIDAZOLAM HCL 2 MG/2ML IJ SOLN
INTRAMUSCULAR | Status: AC
  Filled 2018-07-02: qty 2

## 2018-07-02 MED ORDER — LIDOCAINE 2% (20 MG/ML) 5 ML SYRINGE
INTRAMUSCULAR | Status: AC
  Filled 2018-07-02: qty 5

## 2018-07-02 MED ORDER — PROPOFOL 10 MG/ML IV BOLUS
INTRAVENOUS | Status: AC
  Filled 2018-07-02: qty 20

## 2018-07-02 MED ORDER — PROMETHAZINE HCL 25 MG/ML IJ SOLN
INTRAMUSCULAR | Status: AC
  Filled 2018-07-02: qty 1

## 2018-07-02 MED ORDER — GABAPENTIN 300 MG PO CAPS
300.0000 mg | ORAL_CAPSULE | ORAL | Status: AC
  Administered 2018-07-02: 300 mg via ORAL
  Filled 2018-07-02: qty 1

## 2018-07-02 MED ORDER — FENTANYL CITRATE (PF) 250 MCG/5ML IJ SOLN
INTRAMUSCULAR | Status: DC | PRN
  Administered 2018-07-02: 100 ug via INTRAVENOUS
  Administered 2018-07-02 (×2): 50 ug via INTRAVENOUS

## 2018-07-02 MED ORDER — OXYCODONE HCL 5 MG PO TABS: 5.0000 mg | ORAL_TABLET | Freq: Four times a day (QID) | ORAL | 0 refills | Status: DC | PRN

## 2018-07-02 MED ORDER — ROCURONIUM BROMIDE 10 MG/ML (PF) SYRINGE
PREFILLED_SYRINGE | INTRAVENOUS | Status: AC
  Filled 2018-07-02: qty 10

## 2018-07-02 MED ORDER — PROMETHAZINE HCL 25 MG/ML IJ SOLN
6.2500 mg | INTRAMUSCULAR | Status: DC | PRN
  Administered 2018-07-02: 6.25 mg via INTRAVENOUS

## 2018-07-02 MED ORDER — CHLORHEXIDINE GLUCONATE CLOTH 2 % EX PADS: 6.0000 | MEDICATED_PAD | Freq: Once | CUTANEOUS | Status: DC

## 2018-07-02 MED ORDER — DEXAMETHASONE SODIUM PHOSPHATE 10 MG/ML IJ SOLN
INTRAMUSCULAR | Status: DC | PRN
  Administered 2018-07-02: 4 mg via INTRAVENOUS

## 2018-07-02 MED ORDER — LACTATED RINGERS IV SOLN
INTRAVENOUS | Status: DC
  Administered 2018-07-02: 12:00:00 via INTRAVENOUS

## 2018-07-02 MED ORDER — ACETAMINOPHEN 500 MG PO TABS
1000.0000 mg | ORAL_TABLET | ORAL | Status: AC
  Administered 2018-07-02: 1000 mg via ORAL
  Filled 2018-07-02: qty 2

## 2018-07-02 MED ORDER — CEFAZOLIN SODIUM-DEXTROSE 2-4 GM/100ML-% IV SOLN
2.0000 g | INTRAVENOUS | Status: AC
  Administered 2018-07-02: 2 g via INTRAVENOUS
  Filled 2018-07-02: qty 100

## 2018-07-02 MED ORDER — SUGAMMADEX SODIUM 200 MG/2ML IV SOLN
INTRAVENOUS | Status: DC | PRN
  Administered 2018-07-02: 200 mg via INTRAVENOUS

## 2018-07-02 MED ORDER — MIDAZOLAM HCL 2 MG/2ML IJ SOLN
INTRAMUSCULAR | Status: DC | PRN
  Administered 2018-07-02: 2 mg via INTRAVENOUS

## 2018-07-02 MED ORDER — KETOROLAC TROMETHAMINE 30 MG/ML IJ SOLN
INTRAMUSCULAR | Status: DC | PRN
  Administered 2018-07-02: 15 mg via INTRAVENOUS

## 2018-07-02 MED ORDER — OXYCODONE HCL 5 MG/5ML PO SOLN: 5.0000 mg | Freq: Once | ORAL | Status: DC | PRN

## 2018-07-02 MED ORDER — ACETAMINOPHEN 10 MG/ML IV SOLN: 1000.0000 mg | Freq: Once | INTRAVENOUS | Status: DC | PRN

## 2018-07-02 MED ORDER — ROCURONIUM BROMIDE 10 MG/ML (PF) SYRINGE
PREFILLED_SYRINGE | INTRAVENOUS | Status: DC | PRN
  Administered 2018-07-02: 60 mg via INTRAVENOUS
  Administered 2018-07-02: 10 mg via INTRAVENOUS

## 2018-07-02 MED ORDER — SCOPOLAMINE 1 MG/3DAYS TD PT72
1.0000 | MEDICATED_PATCH | TRANSDERMAL | Status: DC
  Administered 2018-07-02: 1.5 mg via TRANSDERMAL

## 2018-07-02 MED ORDER — BUPIVACAINE-EPINEPHRINE (PF) 0.25% -1:200000 IJ SOLN
INTRAMUSCULAR | Status: AC
  Filled 2018-07-02: qty 30

## 2018-07-02 MED ORDER — BUPIVACAINE-EPINEPHRINE 0.25% -1:200000 IJ SOLN
INTRAMUSCULAR | Status: DC | PRN
  Administered 2018-07-02: 30 mL

## 2018-07-02 MED ORDER — SUGAMMADEX SODIUM 200 MG/2ML IV SOLN
INTRAVENOUS | Status: AC
  Filled 2018-07-02: qty 2

## 2018-07-02 MED ORDER — ONDANSETRON HCL 4 MG/2ML IJ SOLN
INTRAMUSCULAR | Status: DC | PRN
  Administered 2018-07-02: 4 mg via INTRAVENOUS

## 2018-07-02 MED ORDER — ONDANSETRON HCL 4 MG/2ML IJ SOLN
INTRAMUSCULAR | Status: AC
  Filled 2018-07-02: qty 2

## 2018-07-02 MED ORDER — OXYCODONE HCL 5 MG PO TABS: 5.0000 mg | ORAL_TABLET | Freq: Once | ORAL | Status: DC | PRN

## 2018-07-02 MED ORDER — 0.9 % SODIUM CHLORIDE (POUR BTL) OPTIME
TOPICAL | Status: DC | PRN
  Administered 2018-07-02: 1000 mL

## 2018-07-02 MED ORDER — LIDOCAINE 2% (20 MG/ML) 5 ML SYRINGE
INTRAMUSCULAR | Status: DC | PRN
  Administered 2018-07-02: 40 mg via INTRAVENOUS

## 2018-07-02 MED ORDER — PROPOFOL 10 MG/ML IV BOLUS
INTRAVENOUS | Status: DC | PRN
  Administered 2018-07-02: 160 mg via INTRAVENOUS

## 2018-07-02 MED ORDER — DEXAMETHASONE SODIUM PHOSPHATE 10 MG/ML IJ SOLN
INTRAMUSCULAR | Status: AC
  Filled 2018-07-02: qty 1

## 2018-07-02 SURGICAL SUPPLY — 36 items
BANDAGE ADH SHEER 1  50/CT (GAUZE/BANDAGES/DRESSINGS) ×3 IMPLANT
BENZOIN TINCTURE PRP APPL 2/3 (GAUZE/BANDAGES/DRESSINGS) ×3 IMPLANT
BINDER ABDOMINAL 12 ML 46-62 (SOFTGOODS) IMPLANT
CABLE HIGH FREQUENCY MONO STRZ (ELECTRODE) ×3 IMPLANT
CHLORAPREP W/TINT 26ML (MISCELLANEOUS) ×3 IMPLANT
COVER SURGICAL LIGHT HANDLE (MISCELLANEOUS) ×3 IMPLANT
COVER WAND RF STERILE (DRAPES) IMPLANT
DECANTER SPIKE VIAL GLASS SM (MISCELLANEOUS) ×3 IMPLANT
DERMABOND ADVANCED (GAUZE/BANDAGES/DRESSINGS)
DERMABOND ADVANCED .7 DNX12 (GAUZE/BANDAGES/DRESSINGS) IMPLANT
DEVICE SECURE STRAP 25 ABSORB (INSTRUMENTS) ×3 IMPLANT
DEVICE TROCAR PUNCTURE CLOSURE (ENDOMECHANICALS) IMPLANT
DRAPE INCISE IOBAN 66X45 STRL (DRAPES) IMPLANT
DRSG TEGADERM 2-3/8X2-3/4 SM (GAUZE/BANDAGES/DRESSINGS) ×3 IMPLANT
ELECT REM PT RETURN 15FT ADLT (MISCELLANEOUS) ×3 IMPLANT
GAUZE SPONGE 2X2 8PLY STRL LF (GAUZE/BANDAGES/DRESSINGS) ×1 IMPLANT
GOWN STRL REUS W/TWL XL LVL3 (GOWN DISPOSABLE) ×9 IMPLANT
KIT BASIN OR (CUSTOM PROCEDURE TRAY) ×3 IMPLANT
KIT TURNOVER KIT A (KITS) IMPLANT
MARKER SKIN DUAL TIP RULER LAB (MISCELLANEOUS) ×3 IMPLANT
MESH VENTRALIGHT ST 4.5IN (Mesh General) ×3 IMPLANT
NEEDLE SPNL 22GX3.5 QUINCKE BK (NEEDLE) ×3 IMPLANT
SCISSORS LAP 5X35 DISP (ENDOMECHANICALS) ×3 IMPLANT
SET IRRIG TUBING LAPAROSCOPIC (IRRIGATION / IRRIGATOR) IMPLANT
SET TUBE SMOKE EVAC HIGH FLOW (TUBING) ×3 IMPLANT
SHEARS HARMONIC ACE PLUS 36CM (ENDOMECHANICALS) IMPLANT
SLEEVE XCEL OPT CAN 5 100 (ENDOMECHANICALS) ×3 IMPLANT
SPONGE GAUZE 2X2 STER 10/PKG (GAUZE/BANDAGES/DRESSINGS) ×2
SUT MNCRL AB 4-0 PS2 18 (SUTURE) ×3 IMPLANT
SUT NOVA 1 T20/GS 25DT (SUTURE) ×3 IMPLANT
SUT NOVA NAB DX-16 0-1 5-0 T12 (SUTURE) ×6 IMPLANT
SUT VIC AB 3-0 SH 18 (SUTURE) ×6 IMPLANT
TOWEL OR 17X26 10 PK STRL BLUE (TOWEL DISPOSABLE) ×3 IMPLANT
TRAY LAPAROSCOPIC (CUSTOM PROCEDURE TRAY) ×3 IMPLANT
TROCAR BLADELESS OPT 5 100 (ENDOMECHANICALS) ×3 IMPLANT
TROCAR XCEL NON-BLD 11X100MML (ENDOMECHANICALS) ×3 IMPLANT

## 2018-07-02 NOTE — Anesthesia Preprocedure Evaluation (Addendum)
Anesthesia Evaluation  Patient identified by MRN, date of birth, ID band Patient awake    Reviewed: Allergy & Precautions, NPO status , Patient's Chart, lab work & pertinent test results  History of Anesthesia Complications Negative for: history of anesthetic complications  Airway Mallampati: II  TM Distance: >3 FB Neck ROM: Full    Dental  (+) Teeth Intact, Dental Advisory Given   Pulmonary former smoker,    Pulmonary exam normal breath sounds clear to auscultation       Cardiovascular negative cardio ROS Normal cardiovascular exam Rhythm:Regular Rate:Normal     Neuro/Psych negative neurological ROS     GI/Hepatic negative GI ROS, Neg liver ROS,   Endo/Other  negative endocrine ROS  Renal/GU negative Renal ROS     Musculoskeletal negative musculoskeletal ROS (+)   Abdominal   Peds  Hematology negative hematology ROS (+)   Anesthesia Other Findings Day of surgery medications reviewed with the patient.  Umbilical hernia  Reproductive/Obstetrics                            Anesthesia Physical Anesthesia Plan  ASA: II  Anesthesia Plan: General   Post-op Pain Management:    Induction: Intravenous  PONV Risk Score and Plan: 3 and Treatment may vary due to age or medical condition, Ondansetron, Dexamethasone, Midazolam and Scopolamine patch - Pre-op  Airway Management Planned: Oral ETT  Additional Equipment:   Intra-op Plan:   Post-operative Plan: Extubation in OR  Informed Consent: I have reviewed the patients History and Physical, chart, labs and discussed the procedure including the risks, benefits and alternatives for the proposed anesthesia with the patient or authorized representative who has indicated his/her understanding and acceptance.     Dental advisory given  Plan Discussed with: CRNA  Anesthesia Plan Comments:        Anesthesia Quick Evaluation

## 2018-07-02 NOTE — Anesthesia Procedure Notes (Signed)
Procedure Name: Intubation Date/Time: 07/02/2018 1:37 PM Performed by: Eben Burow, CRNA Pre-anesthesia Checklist: Patient identified, Emergency Drugs available, Suction available, Patient being monitored and Timeout performed Patient Re-evaluated:Patient Re-evaluated prior to induction Oxygen Delivery Method: Circle system utilized Preoxygenation: Pre-oxygenation with 100% oxygen Induction Type: IV induction Ventilation: Mask ventilation without difficulty Laryngoscope Size: Mac and 4 Grade View: Grade I Tube type: Oral Tube size: 7.0 mm Number of attempts: 1 Airway Equipment and Method: Stylet Placement Confirmation: ETT inserted through vocal cords under direct vision,  positive ETCO2 and breath sounds checked- equal and bilateral Secured at: 21 cm Tube secured with: Tape Dental Injury: Teeth and Oropharynx as per pre-operative assessment

## 2018-07-02 NOTE — Brief Op Note (Addendum)
07/02/2018  3:14 PM  PATIENT:  Melinda Phillips  28 y.o. female  PRE-OPERATIVE DIAGNOSIS:  UMBILICAL & SUPRAUMBILICAL HERNIA  POST-OPERATIVE DIAGNOSIS:  UMBILICAL & SUPRAUMBILICAL HERNIA  PROCEDURE:  Procedure(s): LAPAROSCOPIC ASSISTED REPAIR OF UMBILICAL AND SUPRAUMBILICAL HERNIA WITH MESH (N/A)  SURGEON:  Surgeon(s) and Role:    Gaynelle Adu, MD - Primary  PHYSICIAN ASSISTANT:   ASSISTANTS: none   ANESTHESIA:   general  EBL:  25 mL   BLOOD ADMINISTERED:none  DRAINS: none   LOCAL MEDICATIONS USED:  MARCAINE     SPECIMEN:  Source of Specimen:  herniated fat  DISPOSITION OF SPECIMEN:  discarded  COUNTS:  YES  TOURNIQUET:  * No tourniquets in log *  DICTATION: .Note written in EPIC [id number]  PLAN OF CARE: Discharge to home after PACU  PATIENT DISPOSITION:  PACU - hemodynamically stable.   Delay start of Pharmacological VTE agent (>24hrs) due to surgical blood loss or risk of bleeding: not applicable  Mary Sella. Andrey Campanile, MD, FACS General, Bariatric, & Minimally Invasive Surgery [facility], [place]

## 2018-07-02 NOTE — H&P (Signed)
Melinda Phillips Documented: 06/12/2018 11:10 AM Location: [facility] Patient #: [id number] DOB: 12/19/1990 Married / Language: [place] / Race: White Female   History of Present Illness Melinda Areola M. Sacred Roa MD; 06/12/2018 11:40 AM) The patient is a 28 year old female who presents with an umbilical hernia. She comes back in to rediscuss her umbilical hernia. I initially saw her in the fall of 2018 for a fat-containing umbilical hernia. She states that she had been doing well and actually been working out and working on a [employer] resolution to work on [employer] loss and she started having more discomfort around her umbilical area. She also noticed an area just above her umbilicus that was uncomfortable and had a small bulge. At one point she developed severe abdominal pain which prompted her to go to the emergency room. She had labs was found to have a mild UTI. And a mild leukocytosis. She had a CT scan which showed a fat-containing umbilical hernia. It measured 3 x 2 cm. On my review there is the fat-containing umbilical hernia but there also was concerns were supraumbilical fascial defect as well   Problem List/Past Medical Melinda Areola M. Andrey Campanile, MD; 06/12/2018 11:42 AM) SUPRAUMBILICAL HERNIA (K43.9)  UMBILICAL HERNIA WITHOUT OBSTRUCTION OR GANGRENE (K42.9)   Past Surgical History Melinda Areola M. Andrey Campanile, MD; 06/12/2018 11:42 AM) No pertinent past surgical history   Diagnostic Studies History Melinda Areola M. Andrey Campanile, MD; 06/12/2018 11:42 AM) Colonoscopy  never Pap Smear  1-5 years ago  Allergies (Melinda Phillips, Phillips; 06/12/2018 11:12 AM) No Known Drug Allergies [01/05/2017]: Allergies Reconciled   Medication History (Melinda Phillips, Phillips; 06/12/2018 11:12 AM) No Current Medications Medications Reconciled  Social History Melinda Areola M. Andrey Campanile, MD; 06/12/2018 11:42 AM) Alcohol use  Remotely quit alcohol use. Caffeine use  Carbonated beverages. Illicit drug use  Remotely quit drug  use. Tobacco use  Former smoker.  Family History Melinda Areola M. Andrey Campanile, MD; 06/12/2018 11:42 AM) Arthritis  Mother. Heart disease in female family member before age 65  Heart disease in female family member before age 10  Hypertension  Father. Migraine Headache  Mother.  Pregnancy / Birth History Melinda Areola M. Andrey Campanile, MD; 06/12/2018 11:42 AM) Age at menarche  16 years. Contraceptive History  Oral contraceptives. Gravida  1 Maternal age  90-25 Para  1 Regular periods   Other Problems Melinda Areola M. Andrey Campanile, MD; 06/12/2018 11:42 AM) Kidney Larina Bras  Migraine Headache  Umbilical Hernia Repair   Vitals (Melinda Phillips; 06/12/2018 11:11 AM) 06/12/2018 11:11 AM Weight: 183.4 lb Height: 64in Body Surface Area: 1.89 m Body Mass Index: 31.48 kg/m  Temp.: 98.46F  Pulse: 98 (Regular)  BP: 120/82 (Sitting, Left Arm, Standard)       Physical Exam Melinda Areola M. Rene Gonsoulin MD; 06/12/2018 11:41 AM) General Mental Status-Alert. General Appearance-Consistent with stated age. Hydration-Well hydrated. Voice-Normal.  Head and Neck Head-normocephalic, atraumatic with no lesions or palpable masses. Trachea-midline. Thyroid Gland Characteristics - normal size and consistency.  Eye Eyeball - Bilateral-Extraocular movements intact. Sclera/Conjunctiva - Bilateral-No scleral icterus.  ENMT Ears -Note: normal ext ears.  Mouth and Throat -Note: lips intact.   Chest and Lung Exam Chest and lung exam reveals -quiet, even and easy respiratory effort with no use of accessory muscles and on auscultation, normal breath sounds, no adventitious sounds and normal vocal resonance. Inspection Chest Wall - Normal. Back - normal.  Breast - Did not examine.  Cardiovascular Cardiovascular examination reveals -normal heart sounds, regular rate and rhythm with no murmurs and normal  pedal pulses bilaterally.  Abdomen Inspection  Inspection of the abdomen reveals: Note:  umbilical hernia; bulge from 9-12 oclock. soft, no skin changes. mild TTP. defect prob 2cm; on standing she also has a supraumbilical bulge. It is difficult to ascertain the fascial dimensions. Is a little bit tender. Skin - Scar - no surgical scars. Palpation/Percussion Palpation and Percussion of the abdomen reveal - Soft, Non Tender, No Rebound tenderness, No Rigidity (guarding) and No hepatosplenomegaly. Auscultation Auscultation of the abdomen reveals - Bowel sounds normal.  Peripheral Vascular Upper Extremity Palpation - Pulses bilaterally normal.  Neurologic Neurologic evaluation reveals -alert and oriented x 3 with no impairment of recent or remote memory. Mental Status-Normal.  Neuropsychiatric The patient's mood and affect are described as -normal. Judgment and Insight-insight is appropriate concerning matters relevant to self.  Musculoskeletal Normal Exam - Left-Upper Extremity Strength Normal and Lower Extremity Strength Normal. Normal Exam - Right-Upper Extremity Strength Normal and Lower Extremity Strength Normal.  Lymphatic Head & Neck  General Head & Neck Lymphatics: Bilateral - Description - Normal. Axillary - Did not examine. Femoral & Inguinal - Did not examine.    Assessment & Plan Melinda Areola M. Tameia Rafferty MD; 06/12/2018 11:42 AM) UMBILICAL HERNIA WITHOUT OBSTRUCTION OR GANGRENE (K42.9) Impression: We discussed the etiology of ventral hernias (umbilical and supraumbilical). We discussed the signs and symptoms of incarceration and strangulation. The patient was given educational material. I also drew diagrams.  We discussed nonoperative and operative management. With respect to operative management, we discussed both open repair and laparoscopic repair and laparoscopic-assisted. We discussed the pros and cons of each approach. I discussed the typical aftercare with each procedure and how each procedure differs.  Given her young age I did not recommend a pure  laparoscopic approach I recommended a laparoscopic assisted repair of umbilical and supraumbilical hernia with mesh. I recommended restoring her abdominal wall anatomy by reapproximating the abdominal wall muscles with mesh placement as reinforcement  The patient has elected to proceed with laparoscopic-assisted repair of umbilical and supraumbilical hernias with mesh  We discussed the risk and benefits of surgery including but not limited to bleeding, infection, injury to surrounding structures, hernia recurrence, mesh complications, hematoma/seroma formation, need to convert to an open procedure, blood clot formation, urinary retention, post operative ileus, general anesthesia risk, long-term abdominal pain. We discussed that this procedure can be quite uncomfortable and difficult to recover from based on how the mesh is secured to the abdominal wall. We discussed the importance of avoiding heavy lifting and straining for a period of 6 weeks. Current Plans Pt Education - Pamphlet Given - Hernia Surgery: discussed with patient and provided information. SUPRAUMBILICAL HERNIA (K43.9)

## 2018-07-02 NOTE — Anesthesia Postprocedure Evaluation (Signed)
Anesthesia Post Note  Patient: ARNOLA NORDBY  Procedure(s) Performed: LAPAROSCOPIC ASSISTED REPAIR OF UMBILICAL AND SUPRAUMBILICAL HERNIA WITH MESH (N/A Abdomen)     Patient location during evaluation: PACU Anesthesia Type: General Level of consciousness: awake and alert Pain management: pain level controlled Vital Signs Assessment: post-procedure vital signs reviewed and stable Respiratory status: spontaneous breathing, nonlabored ventilation and respiratory function stable Cardiovascular status: blood pressure returned to baseline and stable Postop Assessment: no apparent nausea or vomiting Anesthetic complications: no    Last Vitals:  Vitals:   07/02/18 1545 07/02/18 1602  BP: 119/69 123/88  Pulse: 81 82  Resp: 12 14  Temp: 36.7 C 36.6 C  SpO2: 93% 99%    Last Pain:  Vitals:   07/02/18 1545  TempSrc:   PainSc: Asleep                 Kaylyn Layer

## 2018-07-02 NOTE — Transfer of Care (Signed)
Immediate Anesthesia Transfer of Care Note  Patient: Melinda Phillips  Procedure(s) Performed: LAPAROSCOPIC ASSISTED REPAIR OF UMBILICAL AND SUPRAUMBILICAL HERNIA WITH MESH (N/A Abdomen)  Patient Location: PACU  Anesthesia Type:General  Level of Consciousness: awake, alert  and oriented  Airway & Oxygen Therapy: Patient Spontanous Breathing and Patient connected to face mask oxygen  Post-op Assessment: Report given to RN and Post -op Vital signs reviewed and stable  Post vital signs: Reviewed and stable  Last Vitals:  Vitals Value Taken Time  BP 133/76 07/02/2018  3:17 PM  Temp    Pulse 101 07/02/2018  3:19 PM  Resp 23 07/02/2018  3:19 PM  SpO2 100 % 07/02/2018  3:19 PM  Vitals shown include unvalidated device data.  Last Pain:  Vitals:   07/02/18 1146  TempSrc:   PainSc: 0-No pain      Patients Stated Pain Goal: 2 (07/02/18 1146)  Complications: No apparent anesthesia complications

## 2018-07-02 NOTE — Discharge Instructions (Signed)
[facility] °LAPAROSCOPIC SURGERY: POST OP INSTRUCTIONS °Always review your discharge instruction sheet given to you by the facility where your surgery was performed. °IF YOU HAVE DISABILITY OR FAMILY LEAVE FORMS, YOU MUST BRING THEM TO THE OFFICE FOR PROCESSING.   °DO NOT GIVE THEM TO YOUR DOCTOR. ° °PAIN CONTROL ° °1. First take acetaminophen (Tylenol) AND/or ibuprofen (Advil) to control your pain after surgery.  Follow directions on package.  Taking acetaminophen (Tylenol) and/or ibuprofen (Advil) regularly after surgery will help to control your pain and lower the amount of prescription pain medication you may need.  You should not take more than 3,000 mg (3 grams) of acetaminophen (Tylenol) in 24 hours.  You should not take ibuprofen (Advil), aleve, motrin, naprosyn or other NSAIDS if you have a history of stomach ulcers or chronic kidney disease.  °2. A prescription for pain medication may be given to you upon discharge.  Take your pain medication as prescribed, if you still have uncontrolled pain after taking acetaminophen (Tylenol) or ibuprofen (Advil). °3. Use ice packs to help control pain. °4. If you need a refill on your pain medication, please contact your pharmacy.  They will contact our office to request authorization. Prescriptions will not be filled after 5pm or on week-ends. ° °HOME MEDICATIONS °5. Take your usually prescribed medications unless otherwise directed. ° °DIET °6. You should follow a light diet the first few days after arrival home.  Be sure to include lots of fluids daily. Avoid fatty, fried foods.  ° °CONSTIPATION °7. It is common to experience some constipation after surgery and if you are taking pain medication.  Increasing fluid intake and taking a stool softener (such as Colace) will usually help or prevent this problem from occurring.  A mild laxative (Milk of Magnesia or Miralax) should be taken according to package instructions if there are no bowel  movements after 48 hours. ° °WOUND/INCISION CARE °8. Most patients will experience some swelling and bruising in the area of the incisions.  Ice packs will help.  Swelling and bruising can take several days to resolve.  °9. Unless discharge instructions indicate otherwise, follow guidelines below  °a. STERI-STRIPS - you may remove your outer bandages 48 hours after surgery, and you may shower at that time.  You have steri-strips (small skin tapes) in place directly over the incision.  These strips should be left on the skin for 7-10 days.   °b. DERMABOND/SKIN GLUE - you may shower in 24 hours.  The glue will flake off over the next 2-3 weeks. °10. Any sutures or staples will be removed at the office during your follow-up visit. ° °ACTIVITIES °11. You may resume regular (light) daily activities beginning the next day--such as daily self-care, walking, climbing stairs--gradually increasing activities as tolerated.  You may have sexual intercourse when it is comfortable.  Refrain from any heavy lifting or straining until approved by your doctor. °a. You may drive when you are no longer taking prescription pain medication, you can comfortably wear a seatbelt, and you can safely maneuver your car and apply brakes. ° °FOLLOW-UP °12. You should see your doctor in the office for a follow-up appointment approximately 2-3 weeks after your surgery.  You should have been given your post-op/follow-up appointment when your surgery was scheduled.  If you did not receive a post-op/follow-up appointment, make sure that you call for this appointment within a day or two after you arrive home to insure a convenient appointment time. ° °OTHER   INSTRUCTIONS 13. DO NOT LIFT/PUSH/PULL ANYTHING GREATER THAN 10 LBS X 4 WEEKS 14. WEAR ABDOMINAL BINDER FOR COMFORT   WHEN TO CALL YOUR DOCTOR: 1. Fever over 101.0 2. Inability to urinate 3. Continued bleeding from incision. 4. Increased pain, redness, or drainage from the  incision. 5. Increasing abdominal pain  The clinic staff is available to answer your questions during regular business hours.  Please dont hesitate to call and ask to speak to one of the nurses for clinical concerns.  If you have a medical emergency, go to the nearest emergency room or call 911.  A surgeon from [facility] is always on call at the hospital. [address], [place], [place]  [address] ? [address] [address], [place], [place]   [address] [phone] ? [phone] ? FAX [phone] Web site: [email or web address]     Managing Your Pain After Surgery Without Opioids    Thank you for participating in our program to help patients manage their pain after surgery without opioids. This is part of our effort to provide you with the best care possible, without exposing you or your family to the risk that opioids pose.  What pain can I expect after surgery? You can expect to have some pain after surgery. This is normal. The pain is typically worse the day after surgery, and quickly begins to get better. Many studies have found that many patients are able to manage their pain after surgery with Over-the-Counter (OTC) medications such as Tylenol and Motrin. If you have a condition that does not allow you to take Tylenol or Motrin, notify your surgical team.  How will I manage my pain? The best strategy for controlling your pain after surgery is around the clock pain control with Tylenol (acetaminophen) and Motrin (ibuprofen or Advil). Alternating these medications with each other allows you to maximize your pain control. In addition to Tylenol and Motrin, you can use heating pads or ice packs on your incisions to help reduce your pain.  How will I alternate your regular strength over-the-counter pain medication? You will take a dose of pain medication every three hours. ; Start by taking 650 mg of Tylenol (2 pills of 325 mg) ; 3 hours later  take 600 mg of Motrin (3 pills of 200 mg) ; 3 hours after taking the Motrin take 650 mg of Tylenol ; 3 hours after that take 600 mg of Motrin.   - 1 -  See example - if your first dose of Tylenol is at 12:00 PM   12:00 PM Tylenol 650 mg (2 pills of 325 mg)  3:00 PM Motrin 600 mg (3 pills of 200 mg)  6:00 PM Tylenol 650 mg (2 pills of 325 mg)  9:00 PM Motrin 600 mg (3 pills of 200 mg)  Continue alternating every 3 hours   We recommend that you follow this schedule around-the-clock for at least 3 days after surgery, or until you feel that it is no longer needed. Use the table on the last page of this handout to keep track of the medications you are taking. Important: Do not take more than 3000mg  of Tylenol or 2300mg  of Motrin in a 24-hour period. Do not take ibuprofen/Motrin if you have a history of bleeding stomach ulcers, severe kidney disease, &/or actively taking a blood thinner  What if I still have pain? If you have pain that is not controlled with the over-the-counter pain medications (Tylenol and Motrin or Advil) you might have  what we call breakthrough pain. You will receive a prescription for a small amount of an opioid pain medication such as Oxycodone, Tramadol, or Tylenol with Codeine. Use these opioid pills in the first 24 hours after surgery if you have breakthrough pain. Do not take more than 1 pill every 4-6 hours.  If you still have uncontrolled pain after using all opioid pills, don't hesitate to call our staff using the number provided. We will help make sure you are managing your pain in the best way possible, and if necessary, we can provide a prescription for additional pain medication.   Day 1    Time  Name of Medication Number of pills taken  Amount of Acetaminophen  Pain Level   Comments  AM PM       AM PM       AM PM       AM PM       AM PM       AM PM       AM PM       AM PM       Total Daily amount of Acetaminophen Do not take more than  3,000  mg per day      Day 2    Time  Name of Medication Number of pills taken  Amount of Acetaminophen  Pain Level   Comments  AM PM       AM PM       AM PM       AM PM       AM PM       AM PM       AM PM       AM PM       Total Daily amount of Acetaminophen Do not take more than  3,000 mg per day      Day 3    Time  Name of Medication Number of pills taken  Amount of Acetaminophen  Pain Level   Comments  AM PM       AM PM       AM PM       AM PM          AM PM       AM PM       AM PM       AM PM       Total Daily amount of Acetaminophen Do not take more than  3,000 mg per day      Day 4    Time  Name of Medication Number of pills taken  Amount of Acetaminophen  Pain Level   Comments  AM PM       AM PM       AM PM       AM PM       AM PM       AM PM       AM PM       AM PM       Total Daily amount of Acetaminophen Do not take more than  3,000 mg per day      Day 5    Time  Name of Medication Number of pills taken  Amount of Acetaminophen  Pain Level   Comments  AM PM       AM PM       AM PM       AM PM  AM PM       AM PM       AM PM       AM PM       Total Daily amount of Acetaminophen Do not take more than  3,000 mg per day       Day 6    Time  Name of Medication Number of pills taken  Amount of Acetaminophen  Pain Level  Comments  AM PM       AM PM       AM PM       AM PM       AM PM       AM PM       AM PM       AM PM       Total Daily amount of Acetaminophen Do not take more than  3,000 mg per day      Day 7    Time  Name of Medication Number of pills taken  Amount of Acetaminophen  Pain Level   Comments  AM PM       AM PM       AM PM       AM PM       AM PM       AM PM       AM PM       AM PM       Total Daily amount of Acetaminophen Do not take more than  3,000 mg per day        For additional information about how and where to safely dispose of unused opioid medications -  [email or web address]  Disclaimer: This document contains information and/or instructional materials adapted from [facility] for the typical patient with your condition. It does not replace medical advice from your health care provider because your experience may differ from that of the typical patient. Talk to your health care provider if you have any questions about this document, your condition or your treatment plan. Adapted from [facility]   General Anesthesia, Adult, Care After This sheet gives you information about how to care for yourself after your procedure. Your health care provider may also give you more specific instructions. If you have problems or questions, contact your health care provider. What can I expect after the procedure? After the procedure, the following side effects are common:  Pain or discomfort at the IV site.  Nausea.  Vomiting.  Sore throat.  Trouble concentrating.  Feeling cold or chills.  Weak or tired.  Sleepiness and fatigue.  Soreness and body aches. These side effects can affect parts of the body that were not involved in surgery. Follow these instructions at home:  For at least 24 hours after the procedure:  Have a responsible adult stay with you. It is important to have someone help care for you until you are awake and alert.  Rest as needed.  Do not: ? Participate in activities in which you could fall or become injured. ? Drive. ? Use heavy machinery. ? Drink alcohol. ? Take sleeping pills or medicines that cause drowsiness. ? Make important decisions or sign legal documents. ? Take care of children on your own. Eating and drinking  Follow any instructions from your health care provider about eating or drinking restrictions.  When you feel hungry, start by eating small amounts of foods that are soft and easy to digest (bland), such as toast. Gradually return to your  regular diet.  Drink enough fluid to  keep your urine pale yellow.  If you vomit, rehydrate by drinking water, juice, or clear broth. General instructions  If you have sleep apnea, surgery and certain medicines can increase your risk for breathing problems. Follow instructions from your health care provider about wearing your sleep device: ? Anytime you are sleeping, including during daytime naps. ? While taking prescription pain medicines, sleeping medicines, or medicines that make you drowsy.  Return to your normal activities as told by your health care provider. Ask your health care provider what activities are safe for you.  Take over-the-counter and prescription medicines only as told by your health care provider.  If you smoke, do not smoke without supervision.  Keep all follow-up visits as told by your health care provider. This is important. Contact a health care provider if:  You have nausea or vomiting that does not get better with medicine.  You cannot eat or drink without vomiting.  You have pain that does not get better with medicine.  You are unable to pass urine.  You develop a skin rash.  You have a fever.  You have redness around your IV site that gets worse. Get help right away if:  You have difficulty breathing.  You have chest pain.  You have blood in your urine or stool, or you vomit blood. Summary  After the procedure, it is common to have a sore throat or nausea. It is also common to feel tired.  Have a responsible adult stay with you for the first 24 hours after general anesthesia. It is important to have someone help care for you until you are awake and alert.  When you feel hungry, start by eating small amounts of foods that are soft and easy to digest (bland), such as toast. Gradually return to your regular diet.  Drink enough fluid to keep your urine pale yellow.  Return to your normal activities as told by your health care provider. Ask your health care provider what activities  are safe for you. This information is not intended to replace advice given to you by your health care provider. Make sure you discuss any questions you have with your health care provider. Document Released: 07/10/2000 Document Revised: 11/17/2016 Document Reviewed: 11/17/2016 Elsevier Interactive Patient Education  2019 [employer].

## 2018-07-02 NOTE — Interval H&P Note (Signed)
History and Physical Interval Note:  07/02/2018 1:19 PM  Melinda Phillips  has presented today for surgery, with the diagnosis of UMBILICAL SUPRAUMBILICAL HERNIA.  The various methods of treatment have been discussed with the patient and family. After consideration of risks, benefits and other options for treatment, the patient has consented to  Procedure(s): LAPAROSCOPIC ASSISTED REPAIR OF UMBILICAL AND SUPRAUMBILICAL HERNIA WITH MESH (N/A) as a surgical intervention.  The patient's history has been reviewed, patient examined, no change in status, stable for surgery.  I have reviewed the patient's chart and labs.  Questions were answered to the patient's satisfaction.     Gaynelle Adu

## 2018-07-03 ENCOUNTER — Encounter (HOSPITAL_COMMUNITY): Payer: Self-pay | Admitting: General Surgery

## 2018-07-04 NOTE — Op Note (Signed)
NAME: Melinda Phillips, Melinda Phillips MEDICAL RECORD [id number] ACCOUNT [id number] DATE OF BIRTH:December 25, 1990 FACILITY: [facility] LOCATION: [facility] PHYSICIAN:Naevia Unterreiner Elson Clan, MD  OPERATIVE REPORT  DATE OF PROCEDURE:  07/02/2018  PREOPERATIVE DIAGNOSES: 1.  Umbilical hernia. 2.  Supraumbilical hernia.  POSTOPERATIVE DIAGNOSES:   1.  Umbilical hernia. 2.  Supraumbilical hernia.  PROCEDURE:  Laparoscopic-assisted repair of umbilical and supraumbilical hernias with mesh.  SURGEON:  Mary Sella. Andrey Campanile, MD  ASSISTANT:  None.  ANESTHESIA:  General.  ESTIMATED BLOOD LOSS:  25 mL.    SPECIMEN:  Herniated fat which was discarded; round 11 cm Bard Ventralight ST mesh.  INDICATIONS:  The patient is a pleasant 28 year old female who has had over a year history of discomfort off and on around her umbilicus.  She had had a prior CT scan which demonstrated an umbilical hernia as well as the fascial defect of the  supraumbilical position.  She desired repair.  Please see medical records in the office for additional details.  DESCRIPTION OF PROCEDURE:  The patient was given oral Tylenol and gabapentin preoperatively for postoperative pain control.  She emptied her bladder prior to going to the operating room.  She was taken to OR 1 at [facility] and placed supine on  the operating room table.  General endotracheal anesthesia was established.  Sequential compression devices were placed.  The left arm was tucked at her side with the appropriate padding.  Her abdomen was prepped and draped in the usual standard  surgical fashion with ChloraPrep.  A surgical timeout was performed.  I gained access to her abdomen using the Optiview technique.  A small incision was made in the left midclavicular line underneath the left subcostal margin.  Then, using a 0-degree 5  mm laparoscope through a 5 mm trocar, I advanced it through all layers of the abdominal wall into the abdominal cavity.  There was no evidence of  injury to surrounding structures.  Pneumoperitoneum was smoothly established up to 15 mmHg.  There was no  change in vital signs.  Again, no evidence of injury.  The abdominal cavity was surveilled.  There was evidence of an umbilical hernia with a piece of herniated fat.  There also appeared to be a plug of the falciform ligament going into a supraumbilical  hernia.  An additional 5 mm trocar was placed in the left lateral abdominal wall under direct visualization.  I then reduced the herniated falciform ligament through the supraumbilical fascial defect, thus revealing a smaller hernia in the supraumbilical  location.  Then using EndoShears with electrocautery, I took down the falciform ligament in order to free the abdominal wall surrounding tissue.  This confirmed only 2 fascial defects, the larger one at the umbilical region and a smaller one in the  supraumbilical position.  At this point, I decided to do the open portion of the procedure.  A small curvilinear infraumbilical incision was made with a 15 blade.  Deep dermis was incised.  I then tunneled a Kelly up and around the umbilical stalk and  separated the umbilical stalk from the herniated fat with electrocautery.  This revealed a plug of herniated fat, which was excised with electrocautery.  The fascial defect was about 1.5 cm.  There was a space of 1 inch of intact muscle superior to the  umbilical fascial defect and then a small supraumbilical fascial defect of about 0.5 cm.  In order to have overlap over both areas, I selected a round piece of Bard Ventralight ST  mesh measuring 11 cm.  Four sutures were placed in the [place], [place]  and [place] location along the edge of the mesh using a #1 Novafil suture and tied.  The mesh was then moistened and placed in the abdominal cavity through the umbilical fascial defect.  I then closed the umbilical fascial defect transversely with 4  interrupted #1 Novafil sutures.  We then returned  laparoscopically and reestablished pneumoperitoneum.  I then closed the supraumbilical fascial defect with 3 interrupted #1 Novafil sutures using the Endo Close device.  I then unrolled the mesh and  brought up each of the previously placed stay sutures in the mesh through the abdominal wall using the Endo Close device in typical laparoscopic hernia repair technique.  The mesh was flush against the abdominal wall.  I then used the Ethicon SecureStrap  to place tacks between each of the previously placed 4 stay sutures.  An inner crown of tacks was placed as well.  I then placed local in the abdominal wall in both the left and right lateral abdominal walls, as well as around the transfascial suture  sites.  Pneumoperitoneum was released, and the trocars were removed.  The base of the umbilical stalk was then tacked back down to the fascia with 3-0 Vicryl.  The dermis was reapproximated with inverted interrupted 3-0 Vicryl sutures.  I then closed the  skin incisions with a running 4-0 Monocryl, followed by benzoin, Steri-Strips, and sterile bandages.  An abdominal binder was placed.  The patient was taken to the recovery room in stable condition.  There were no immediate complications.  The patient  tolerated the procedure well.  LN/NUANCE  D:07/04/2018 T:07/04/2018 JOB:[id number]

## 2018-07-19 ENCOUNTER — Telehealth: Payer: 59 | Admitting: Family

## 2018-07-19 DIAGNOSIS — R0600 Dyspnea, unspecified: Secondary | ICD-10-CM

## 2018-07-19 NOTE — Progress Notes (Signed)
  E-Visit for Corona Virus Screening  Based on what you have shared with me, you need to seek an evaluation for a severe illness that is causing your symptoms which may be coronavirus or some other illness. I recommend that you be seen and evaluated "face to face". Our Emergency Departments are best equipped to handle patients with severe symptoms.   I recommend the following:  . If you are having a true medical emergency please call 911. . If you are considered high risk for Corona virus because of a known exposure, fever, shortness of breath and cough, OR if you have severe symptoms of any kind, seek medical care at an emergency room.  . Please call ahead and tell them that you were seen by telemedicine and they have recommended that you have a face to face evaluation. . [facility] Emergency Department [address], [place], [place] [address] [phone]  . [facility] [place] Emergency Department [address], [place], [place] [address] [phone]  . [facility] Emergency Department [address], [place], [place] [address] [phone]  . [facility] Emergency Department [address], [place], [place] [address] [phone]  . [facility] Emergency Department [address], [place], [place] [address] [phone]  NOTE: If you entered your credit card information for this eVisit, you will not be charged. You may see a "hold" on your card for the $35 but that hold will drop off and you will not have a charge processed.   Your e-visit answers were reviewed by a board certified advanced clinical practitioner to complete your personal care plan.  Thank you for using e-Visits.  

## 2019-12-06 ENCOUNTER — Inpatient Hospital Stay (HOSPITAL_COMMUNITY)
Admission: AD | Admit: 2019-12-06 | Discharge: 2019-12-06 | Disposition: A | Discharge status: Home / Self Care | Payer: Medicaid Other | Attending: Obstetrics and Gynecology | Admitting: Obstetrics and Gynecology

## 2019-12-06 ENCOUNTER — Encounter (HOSPITAL_COMMUNITY): Payer: Self-pay | Admitting: Obstetrics and Gynecology

## 2019-12-06 ENCOUNTER — Other Ambulatory Visit: Payer: Self-pay

## 2019-12-06 DIAGNOSIS — R42 Dizziness and giddiness: Secondary | ICD-10-CM | POA: Insufficient documentation

## 2019-12-06 DIAGNOSIS — Z3A01 Less than 8 weeks gestation of pregnancy: Secondary | ICD-10-CM | POA: Diagnosis not present

## 2019-12-06 DIAGNOSIS — O219 Vomiting of pregnancy, unspecified: Secondary | ICD-10-CM | POA: Diagnosis present

## 2019-12-06 DIAGNOSIS — Z87891 Personal history of nicotine dependence: Secondary | ICD-10-CM | POA: Insufficient documentation

## 2019-12-06 DIAGNOSIS — O26891 Other specified pregnancy related conditions, first trimester: Secondary | ICD-10-CM | POA: Insufficient documentation

## 2019-12-06 DIAGNOSIS — Z87442 Personal history of urinary calculi: Secondary | ICD-10-CM | POA: Diagnosis not present

## 2019-12-06 LAB — URINALYSIS, ROUTINE W REFLEX MICROSCOPIC
Bilirubin Urine: NEGATIVE
Glucose, UA: NEGATIVE mg/dL
Hgb urine dipstick: NEGATIVE
Ketones, ur: NEGATIVE mg/dL
Nitrite: NEGATIVE
Protein, ur: NEGATIVE mg/dL
Specific Gravity, Urine: 1.018 (ref 1.005–1.030)
pH: 6 (ref 5.0–8.0)

## 2019-12-06 LAB — POCT PREGNANCY, URINE: Preg Test, Ur: POSITIVE — AB

## 2019-12-06 MED ORDER — FAMOTIDINE 20 MG PO TABS: 20.0000 mg | ORAL_TABLET | Freq: Two times a day (BID) | ORAL | 0 refills | Status: DC

## 2019-12-06 MED ORDER — PROMETHAZINE HCL 25 MG PO TABS: 25.0000 mg | ORAL_TABLET | Freq: Four times a day (QID) | ORAL | 0 refills | Status: DC | PRN

## 2019-12-06 MED ORDER — ONDANSETRON 4 MG PO TBDP: 4.0000 mg | ORAL_TABLET | Freq: Three times a day (TID) | ORAL | 0 refills | Status: DC | PRN

## 2019-12-06 MED ORDER — LACTATED RINGERS IV BOLUS
1000.0000 mL | Freq: Once | INTRAVENOUS | Status: AC
  Administered 2019-12-06: 1000 mL via INTRAVENOUS

## 2019-12-06 MED ORDER — PROMETHAZINE HCL 25 MG/ML IJ SOLN
25.0000 mg | Freq: Once | INTRAMUSCULAR | Status: AC
  Administered 2019-12-06: 25 mg via INTRAVENOUS
  Filled 2019-12-06: qty 1

## 2019-12-06 MED ORDER — FAMOTIDINE IN NACL 20-0.9 MG/50ML-% IV SOLN
20.0000 mg | Freq: Once | INTRAVENOUS | Status: AC
  Administered 2019-12-06: 20 mg via INTRAVENOUS
  Filled 2019-12-06: qty 50

## 2019-12-06 NOTE — MAU Note (Addendum)
Pt here with reports of nausea and vomiting. She reports for the last 2 days, she has not been able to keep down food. Reports she was able to keep fluids down yesterday, but today she cannot keep down food or liquids. Was sent Rx for Diclegis but insurance does not cover it. Has been trying OTC remedies which do not help. Reports she "passed out" around 4pm today, did not hit head, just landed on side. Was able to catch herself. Called MD and was told to come in. States she is [redacted] weeks pregnant and has an appointment with Dr Reina Fuse in September. LMP: 10/18/2019

## 2019-12-06 NOTE — MAU Provider Note (Signed)
History     CSN: [id number]  Arrival date and time: 12/06/19 6295   First Provider Initiated Contact with Patient 12/06/19 1937      Chief Complaint  Patient presents with  . Nausea  . Emesis   HPI Melinda Phillips is a 29 y.o. G2P1001 at [redacted]w[redacted]d who presents with nausea and vomiting. She states this has been ongoing since she found out she was pregnant but got worse over the last 2 days. She has been unable to keep any food or liquid down. She was prescribed diclegis to try but it is not covered by her insurance so she has not started it. She reports OTC methods are not working. She states she got dizzy and felt like she was going to pass out around 1600 but caught herself. She denies pain, vaginal bleeding or discharge. She has an appointment with [facility] in September.  OB History    Gravida  2   Para  1   Term  1   Preterm      AB      Living  1     SAB      TAB      Ectopic      Multiple  0   Live Births  1           Past Medical History:  Diagnosis Date  . History of kidney stones   . Mononucleosis   . Strep throat 05/2015  . Umbilical hernia     Past Surgical History:  Procedure Laterality Date  . NO PAST SURGERIES    . UMBILICAL HERNIA REPAIR N/A 07/02/2018   Procedure: LAPAROSCOPIC ASSISTED REPAIR OF UMBILICAL AND SUPRAUMBILICAL HERNIA WITH MESH;  Surgeon: Gaynelle Adu, MD;  Location: [facility];  Service: General;  Laterality: N/A;    Family History  Problem Relation Age of Onset  . Hypertension Mother   . Hypertension Father   . Hypertension Maternal Grandmother   . Diabetes Maternal Grandmother   . Hypertension Maternal Grandfather   . Hypertension Paternal Grandmother   . Hypertension Paternal Grandfather   . Heart disease Paternal Grandfather     Social History   Tobacco Use  . Smoking status: Former Smoker    Types: Cigarettes    Quit date: 09/26/2013    Years since quitting: 6.1  . Smokeless tobacco: Never Used  Vaping Use   . Vaping Use: Never used  Substance Use Topics  . Alcohol use: No  . Drug use: No    Allergies:  Allergies  Allergen Reactions  . Bee Venom Anaphylaxis  . Cinnamon Anaphylaxis    Medications Prior to Admission  Medication Sig Dispense Refill Last Dose  . oxyCODONE (OXY IR/ROXICODONE) 5 MG immediate release tablet Take 1 tablet (5 mg total) by mouth every 6 (six) hours as needed for severe pain. 20 tablet 0     Review of Systems  Constitutional: Negative.  Negative for fatigue and fever.  HENT: Negative.   Respiratory: Negative.  Negative for shortness of breath.   Cardiovascular: Negative.  Negative for chest pain.  Gastrointestinal: Positive for nausea and vomiting. Negative for abdominal pain, constipation and diarrhea.  Genitourinary: Negative.  Negative for dysuria, vaginal bleeding and vaginal discharge.  Neurological: Negative.  Negative for dizziness and headaches.   Physical Exam   Blood pressure 130/77, pulse 83, temperature 98.4 F (36.9 C), temperature source Oral, resp. rate 18, height 5\' 4"  (1.626 m), weight 82.3 kg, last menstrual period 10/18/2019,  SpO2 98 %, unknown if currently breastfeeding.  Physical Exam Vitals and nursing note reviewed.  Constitutional:      General: She is not in acute distress.    Appearance: She is well-developed.  HENT:     Head: Normocephalic.  Eyes:     Pupils: Pupils are equal, round, and reactive to light.  Cardiovascular:     Rate and Rhythm: Normal rate and regular rhythm.     Heart sounds: Normal heart sounds.  Pulmonary:     Effort: Pulmonary effort is normal. No respiratory distress.     Breath sounds: Normal breath sounds.  Abdominal:     General: Bowel sounds are normal. There is no distension.     Palpations: Abdomen is soft.     Tenderness: There is no abdominal tenderness.  Skin:    General: Skin is warm and dry.  Neurological:     Mental Status: She is alert and oriented to person, place, and time.   Psychiatric:        Behavior: Behavior normal.        Thought Content: Thought content normal.        Judgment: Judgment normal.     MAU Course  Procedures Results for orders placed or performed during the hospital encounter of 12/06/19 (from the past 24 hour(s))  Urinalysis, Routine w reflex microscopic Urine, Clean Catch     Status: Abnormal   Collection Time: 12/06/19  7:29 PM  Result Value Ref Range   Color, Urine YELLOW YELLOW   APPearance CLOUDY (A) CLEAR   Specific Gravity, Urine 1.018 1.005 - 1.030   pH 6.0 5.0 - 8.0   Glucose, UA NEGATIVE NEGATIVE mg/dL   Hgb urine dipstick NEGATIVE NEGATIVE   Bilirubin Urine NEGATIVE NEGATIVE   Ketones, ur NEGATIVE NEGATIVE mg/dL   Protein, ur NEGATIVE NEGATIVE mg/dL   Nitrite NEGATIVE NEGATIVE   Leukocytes,Ua LARGE (A) NEGATIVE   RBC / HPF 0-5 0 - 5 RBC/hpf   WBC, UA 21-50 0 - 5 WBC/hpf   Bacteria, UA RARE (A) NONE SEEN   Squamous Epithelial / LPF 21-50 0 - 5   Mucus PRESENT   Pregnancy, urine POC     Status: Abnormal   Collection Time: 12/06/19  7:30 PM  Result Value Ref Range   Preg Test, Ur POSITIVE (A) NEGATIVE     MDM UA, UPT, UC LR bolus Phenergan IV Pepcid IV  Care turned over to J. Rasch NP at 2000.   Rolm Bookbinder, CNM 12/06/19 8:10 PM   Assessment and Plan   1. Nausea/vomiting in pregnancy   2. [redacted] weeks gestation of pregnancy    -Discharge home in stable condition -Rx for phenergan, pepcid and zofran sent to patient's pharmacy -First trimester precautions discussed -Patient advised to follow-up with OB as scheduled for prenatal care -Patient may return to MAU as needed or if her condition were to change or worsen

## 2019-12-06 NOTE — Discharge Instructions (Signed)
Safe Medications in Pregnancy  ° °Acne: °Benzoyl Peroxide °Salicylic Acid ° °Backache/Headache: °Tylenol: 2 regular strength every 4 hours OR °             2 Extra strength every 6 hours ° °Colds/Coughs/Allergies: °Benadryl (alcohol free) 25 mg every 6 hours as needed °Breath right strips °Claritin °Cepacol throat lozenges °Chloraseptic throat spray °Cold-Eeze- up to three times per day °Cough drops, alcohol free °Flonase (by prescription only) °Guaifenesin °Mucinex °Robitussin DM (plain only, alcohol free) °Saline nasal spray/drops °Sudafed (pseudoephedrine) & Actifed ** use only after [redacted] weeks gestation and if you do not have high blood pressure °Tylenol °Vicks Vaporub °Zinc lozenges °Zyrtec  ° °Constipation: °Colace °Ducolax suppositories °Fleet enema °Glycerin suppositories °Metamucil °Milk of magnesia °Miralax °Senokot °Smooth move tea ° °Diarrhea: °Kaopectate °Imodium A-D ° °*NO pepto Bismol ° °Hemorrhoids: °Anusol °Anusol HC °Preparation H °Tucks ° °Indigestion: °Tums °Maalox °Mylanta °Zantac  °Pepcid ° °Insomnia: °Benadryl (alcohol free) 25mg every 6 hours as needed °Tylenol PM °Unisom, no Gelcaps ° °Leg Cramps: °Tums °MagGel ° °Nausea/Vomiting:  °Bonine °Dramamine °Emetrol °Ginger extract °Sea bands °Meclizine  °Nausea medication to take during pregnancy:  °Unisom (doxylamine succinate 25 mg tablets) Take one tablet daily at bedtime. If symptoms are not adequately controlled, the dose can be increased to a maximum recommended dose of two tablets daily (1/2 tablet in the morning, 1/2 tablet mid-afternoon and one at bedtime). °Vitamin B6 100mg tablets. Take one tablet twice a day (up to 200 mg per day). ° °Skin Rashes: °Aveeno products °Benadryl cream or 25mg every 6 hours as needed °Calamine Lotion °1% cortisone cream ° °Yeast infection: °Gyne-lotrimin 7 °Monistat 7 ° ° °**If taking multiple medications, please check labels to avoid duplicating the same active ingredients °**take medication as directed on  the label °** Do not exceed 4000 mg of tylenol in 24 hours °**Do not take medications that contain aspirin or ibuprofen ° ° ° ° °Morning Sickness ° °Morning sickness is when a woman feels nauseous during pregnancy. This nauseous feeling may or may not come with vomiting. It often occurs in the morning, but it can be a problem at any time of day. Morning sickness is most common during the first trimester. In some cases, it may continue throughout pregnancy. Although morning sickness is unpleasant, it is usually harmless unless the woman develops severe and continual vomiting (hyperemesis gravidarum), a condition that requires more intense treatment. °What are the causes? °The exact cause of this condition is not known, but it seems to be related to normal hormonal changes that occur in pregnancy. °What increases the risk? °You are more likely to develop this condition if: °· You experienced nausea or vomiting before your pregnancy. °· You had morning sickness during a previous pregnancy. °· You are pregnant with more than one baby, such as twins. °What are the signs or symptoms? °Symptoms of this condition include: °· Nausea. °· Vomiting. °How is this diagnosed? °This condition is usually diagnosed based on your signs and symptoms. °How is this treated? °In many cases, treatment is not needed for this condition. Making some changes to what you eat may help to control symptoms. Your health care provider may also prescribe or recommend: °· Vitamin B6 supplements. °· Anti-nausea medicines. °· Ginger. °Follow these instructions at home: °Medicines °· Take over-the-counter and prescription medicines only as told by your health care provider. Do not use any prescription, over-the-counter, or herbal medicines for morning sickness without first talking with your health care provider. °·   Taking multivitamins before getting pregnant can prevent or decrease the severity of morning sickness in most women. °Eating and  drinking °· Eat a piece of dry toast or crackers before getting out of bed in the morning. °· Eat 5 or 6 small meals a day. °· Eat dry and bland foods, such as rice or a baked potato. Foods that are high in carbohydrates are often helpful. °· Avoid greasy, fatty, and spicy foods. °· Have someone cook for you if the smell of any food causes nausea and vomiting. °· If you feel nauseous after taking prenatal vitamins, take the vitamins at night or with a snack. °· Snack on protein foods between meals if you are hungry. Nuts, yogurt, and cheese are good options. °· Drink fluids throughout the day. °· Try ginger ale made with real ginger, ginger tea made from fresh grated ginger, or ginger candies. °General instructions °· Do not use any products that contain nicotine or tobacco, such as cigarettes and e-cigarettes. If you need help quitting, ask your health care provider. °· Get an air purifier to keep the air in your house free of odors. °· Get plenty of fresh air. °· Try to avoid odors that trigger your nausea. °· Consider trying these methods to help relieve symptoms: °? Wearing an acupressure wristband. These wristbands are often worn for seasickness. °? Acupuncture. °Contact a health care provider if: °· Your home remedies are not working and you need medicine. °· You feel dizzy or light-headed. °· You are losing weight. °Get help right away if: °· You have persistent and uncontrolled nausea and vomiting. °· You faint. °· You have severe pain in your abdomen. °Summary °· Morning sickness is when a woman feels nauseous during pregnancy. This nauseous feeling may or may not come with vomiting. °· Morning sickness is most common during the first trimester. °· It often occurs in the morning, but it can be a problem at any time of day. °· In many cases, treatment is not needed for this condition. Making some changes to what you eat may help to control symptoms. °This information is not intended to replace advice given  to you by your health care provider. Make sure you discuss any questions you have with your health care provider. °Document Revised: 03/16/2017 Document Reviewed: 05/06/2016 °Elsevier Patient Education © 2020 [employer] ° °

## 2019-12-08 LAB — CULTURE, OB URINE: Culture: >=100000 — AB

## 2019-12-24 LAB — OB RESULTS CONSOLE HIV ANTIBODY (ROUTINE TESTING): HIV: NONREACTIVE

## 2019-12-24 LAB — OB RESULTS CONSOLE ANTIBODY SCREEN: Antibody Screen: NEGATIVE

## 2019-12-24 LAB — OB RESULTS CONSOLE ABO/RH: RH Type: POSITIVE

## 2019-12-24 LAB — OB RESULTS CONSOLE RPR: RPR: NONREACTIVE

## 2019-12-24 LAB — OB RESULTS CONSOLE GC/CHLAMYDIA
Chlamydia: NEGATIVE
Gonorrhea: NEGATIVE

## 2019-12-24 LAB — OB RESULTS CONSOLE RUBELLA ANTIBODY, IGM: Rubella: IMMUNE

## 2019-12-24 LAB — OB RESULTS CONSOLE HEPATITIS B SURFACE ANTIGEN: Hepatitis B Surface Ag: NEGATIVE

## 2019-12-30 ENCOUNTER — Other Ambulatory Visit: Payer: Self-pay

## 2020-01-26 ENCOUNTER — Encounter (HOSPITAL_COMMUNITY): Payer: Self-pay | Admitting: Obstetrics and Gynecology

## 2020-01-26 ENCOUNTER — Inpatient Hospital Stay (HOSPITAL_COMMUNITY)
Admission: AD | Admit: 2020-01-26 | Discharge: 2020-01-27 | Disposition: A | Discharge status: Home / Self Care | Payer: 59 | Attending: Obstetrics and Gynecology | Admitting: Obstetrics and Gynecology

## 2020-01-26 ENCOUNTER — Other Ambulatory Visit: Payer: Self-pay

## 2020-01-26 DIAGNOSIS — O219 Vomiting of pregnancy, unspecified: Secondary | ICD-10-CM | POA: Insufficient documentation

## 2020-01-26 DIAGNOSIS — G43009 Migraine without aura, not intractable, without status migrainosus: Secondary | ICD-10-CM | POA: Diagnosis not present

## 2020-01-26 DIAGNOSIS — O99352 Diseases of the nervous system complicating pregnancy, second trimester: Secondary | ICD-10-CM | POA: Diagnosis not present

## 2020-01-26 DIAGNOSIS — Z87891 Personal history of nicotine dependence: Secondary | ICD-10-CM | POA: Diagnosis not present

## 2020-01-26 DIAGNOSIS — Z3A14 14 weeks gestation of pregnancy: Secondary | ICD-10-CM | POA: Diagnosis not present

## 2020-01-26 DIAGNOSIS — O26852 Spotting complicating pregnancy, second trimester: Secondary | ICD-10-CM | POA: Diagnosis not present

## 2020-01-26 DIAGNOSIS — O26859 Spotting complicating pregnancy, unspecified trimester: Secondary | ICD-10-CM

## 2020-01-26 DIAGNOSIS — Z79899 Other long term (current) drug therapy: Secondary | ICD-10-CM | POA: Insufficient documentation

## 2020-01-26 LAB — URINALYSIS, ROUTINE W REFLEX MICROSCOPIC
Glucose, UA: NEGATIVE mg/dL
Hgb urine dipstick: NEGATIVE
Ketones, ur: 80 mg/dL — AB
Nitrite: NEGATIVE
Protein, ur: 100 mg/dL — AB
Specific Gravity, Urine: 1.027 (ref 1.005–1.030)
WBC, UA: >50 WBC/hpf — ABNORMAL HIGH (ref 0–5)
pH: 5 (ref 5.0–8.0)

## 2020-01-26 MED ORDER — METOCLOPRAMIDE HCL 5 MG/ML IJ SOLN
10.0000 mg | Freq: Once | INTRAMUSCULAR | Status: AC
  Administered 2020-01-26: 10 mg via INTRAVENOUS
  Filled 2020-01-26: qty 2

## 2020-01-26 MED ORDER — LACTATED RINGERS IV BOLUS
1000.0000 mL | Freq: Once | INTRAVENOUS | Status: AC
  Administered 2020-01-26: 1000 mL via INTRAVENOUS

## 2020-01-26 MED ORDER — DIPHENHYDRAMINE HCL 50 MG/ML IJ SOLN
25.0000 mg | Freq: Once | INTRAMUSCULAR | Status: AC
  Administered 2020-01-26: 25 mg via INTRAVENOUS
  Filled 2020-01-26: qty 1

## 2020-01-26 MED ORDER — DEXAMETHASONE SODIUM PHOSPHATE 10 MG/ML IJ SOLN
10.0000 mg | Freq: Once | INTRAMUSCULAR | Status: AC
  Administered 2020-01-26: 10 mg via INTRAVENOUS
  Filled 2020-01-26: qty 1

## 2020-01-26 NOTE — MAU Note (Signed)
.   Melinda Phillips is a 29 y.o. at [redacted]w[redacted]d here in MAU reporting: migraine that started Saturday. She states that she cannot sleep and it is causing her to have nausea and vomiting. She had tried to take 1,000 mg of tylenol once every 6 hours for the past few days and it has not helped. States that she had vaginal spotting last night and tonight she passed a nickel sized clot that was bright red. Denies abdominal pain. Last intercourse was x3 months ago.   Pain score: 8 Vitals:   01/26/20 2214  BP: 120/76  Pulse: 80  Resp: 16  Temp: 98.7 F (37.1 C)  SpO2: 100%     FHT:156 Lab orders placed from triage: UA

## 2020-01-27 ENCOUNTER — Encounter (HOSPITAL_COMMUNITY): Payer: Self-pay | Admitting: Obstetrics and Gynecology

## 2020-01-27 MED ORDER — CYCLOBENZAPRINE HCL 10 MG PO TABS: 10.0000 mg | ORAL_TABLET | Freq: Two times a day (BID) | ORAL | 0 refills | Status: DC | PRN

## 2020-01-27 MED ORDER — ONDANSETRON 4 MG PO TBDP: 4.0000 mg | ORAL_TABLET | Freq: Three times a day (TID) | ORAL | 0 refills | Status: DC | PRN

## 2020-01-27 NOTE — MAU Provider Note (Signed)
History     CSN: [id number]  Arrival date and time: 01/26/20 2157   First Provider Initiated Contact with Patient 01/26/20 2232      Chief Complaint  Patient presents with  . Headache   Melinda Phillips is a 29 y.o. year old G39P1001 female at [redacted]w[redacted]d weeks gestation who presents to MAU reporting migraine headache x 3 days. She reports that the headache is so bad that she cannot sleep and is causing her to have nausea and vomiting.  She has taken Tylenol 1000 mg by mouth every 6 hours for the pain without any relief.  She reports she also has some vaginal spotting last night and this evening.  This evening she passed of bright red blood clot after vomiting denies any pain she reports last sexual intercourse was 3 months ago.  She receives her prenatal care at [facility].    OB History    Gravida  2   Para  1   Term  1   Preterm      AB      Living  1     SAB      TAB      Ectopic      Multiple  0   Live Births  1           Past Medical History:  Diagnosis Date  . History of kidney stones   . Mononucleosis   . Strep throat 05/2015  . Umbilical hernia     Past Surgical History:  Procedure Laterality Date  . NO PAST SURGERIES    . UMBILICAL HERNIA REPAIR N/A 07/02/2018   Procedure: LAPAROSCOPIC ASSISTED REPAIR OF UMBILICAL AND SUPRAUMBILICAL HERNIA WITH MESH;  Surgeon: Gaynelle Adu, MD;  Location: [facility];  Service: General;  Laterality: N/A;    Family History  Problem Relation Age of Onset  . Hypertension Mother   . Hypertension Father   . Hypertension Maternal Grandmother   . Diabetes Maternal Grandmother   . Hypertension Maternal Grandfather   . Hypertension Paternal Grandmother   . Hypertension Paternal Grandfather   . Heart disease Paternal Grandfather     Social History   Tobacco Use  . Smoking status: Former Smoker    Types: Cigarettes    Quit date: 09/26/2013    Years since quitting: 6.3  . Smokeless tobacco: Never Used   Vaping Use  . Vaping Use: Never used  Substance Use Topics  . Alcohol use: No  . Drug use: No    Allergies:  Allergies  Allergen Reactions  . Bee Venom Anaphylaxis  . Cinnamon Anaphylaxis    Medications Prior to Admission  Medication Sig Dispense Refill Last Dose  . acetaminophen (TYLENOL) 500 MG tablet Take 1,000 mg by mouth every 6 (six) hours as needed.   01/26/2020 at 0900  . famotidine (PEPCID) 20 MG tablet Take 1 tablet (20 mg total) by mouth 2 (two) times daily. 30 tablet 0 Past Week at Unknown time  . ondansetron (ZOFRAN ODT) 4 MG disintegrating tablet Take 1 tablet (4 mg total) by mouth every 8 (eight) hours as needed for nausea or vomiting. 30 tablet 0 01/25/2020 at Unknown time  . Prenatal Vit-Fe Fumarate-FA (MULTIVITAMIN-PRENATAL) 27-0.8 MG TABS tablet Take 1 tablet by mouth daily at 12 noon.   01/26/2020 at Unknown time  . promethazine (PHENERGAN) 25 MG tablet Take 1 tablet (25 mg total) by mouth every 6 (six) hours as needed for nausea or vomiting. 30 tablet 0   .  pyridOXINE (VITAMIN B-6) 25 MG tablet Take 25 mg by mouth 2 (two) times daily.       Review of Systems  Constitutional: Negative.   HENT: Negative.   Eyes: Negative.   Respiratory: Negative.   Cardiovascular: Negative.   Gastrointestinal: Positive for nausea and vomiting.  Endocrine: Negative.   Genitourinary: Positive for vaginal bleeding (passed a quarter sized blood clot after vomiting this evening ).  Musculoskeletal: Negative.   Skin: Negative.   Allergic/Immunologic: Negative.   Neurological: Positive for headaches (migraine x 3 days).  Hematological: Negative.   Psychiatric/Behavioral: Negative.    Physical Exam   Blood pressure 120/76, pulse 80, temperature 98.7 F (37.1 C), temperature source Oral, resp. rate 16, last menstrual period 10/18/2019, SpO2 100 %, unknown if currently breastfeeding.  Physical Exam Vitals and nursing note reviewed.  Constitutional:      Appearance: She is  well-developed and normal weight.  HENT:     Head: Normocephalic and atraumatic.  Eyes:     Pupils: Pupils are equal, round, and reactive to light.  Cardiovascular:     Rate and Rhythm: Normal rate.     Heart sounds: Normal heart sounds.  Pulmonary:     Effort: Pulmonary effort is normal.  Abdominal:     Palpations: Abdomen is soft.  Genitourinary:    General: Normal vulva.     Comments: Uterus: non-tender, SE: cervix is smooth, pink, no lesions, small amt of thick, clear and white mucoid vaginal d/c -- closed/long/firm, no CMT or friability, no adnexal tenderness NO evidence of blood in vagina Musculoskeletal:        General: Normal range of motion.     Cervical back: Normal range of motion.  Skin:    General: Skin is warm and dry.  Neurological:     Mental Status: She is alert and oriented to person, place, and time.  Psychiatric:        Mood and Affect: Mood normal.        Behavior: Behavior normal.        Thought Content: Thought content normal.        Judgment: Judgment normal.     FHTs by doppler: 156 bpm  MAU Course  Procedures  MDM CCUA Received migraine headache cocktail (IVFs with benadryl 25 mg IVP, metoclopramide 10 mg IVP, Decadron 10 mg IVP) -- reports relief.  Results for orders placed or performed during the hospital encounter of 01/26/20 (from the past 24 hour(s))  Urinalysis, Routine w reflex microscopic Urine, Clean Catch     Status: Abnormal   Collection Time: 01/26/20 10:30 PM  Result Value Ref Range   Color, Urine AMBER (A) YELLOW   APPearance CLOUDY (A) CLEAR   Specific Gravity, Urine 1.027 1.005 - 1.030   pH 5.0 5.0 - 8.0   Glucose, UA NEGATIVE NEGATIVE mg/dL   Hgb urine dipstick NEGATIVE NEGATIVE   Bilirubin Urine SMALL (A) NEGATIVE   Ketones, ur 80 (A) NEGATIVE mg/dL   Protein, ur 194 (A) NEGATIVE mg/dL   Nitrite NEGATIVE NEGATIVE   Leukocytes,Ua LARGE (A) NEGATIVE   RBC / HPF 6-10 0 - 5 RBC/hpf   WBC, UA >50 (H) 0 - 5 WBC/hpf    Bacteria, UA FEW (A) NONE SEEN   Squamous Epithelial / LPF 21-50 0 - 5   Mucus PRESENT     Assessment and Plan  Migraine without aura and without status migrainosus, not intractable  - Rx for Flexeril 10 mg BOD prn migraine - Rx  for Zofran 4 mg ODT per patient request - Information provided on migraine h/a  Spotting during pregnancy - Reassurance given that spotting was more than likely from the excessive vomiting she has had - Notified there was no evidence of bleeding in her vagina at all. - Return to MAU:  If you have heavier bleeding that soaks through more that 2 pads per hour for an hour or more  If you bleed so much that you feel like you might pass out or you do pass out  If you have significant abdominal pain that is not improved with Tylenol 1000 mg every 6 hours as needed for pain  If you develop a fever > 100.5  - Discharge patient - Keep scheduled appt with [facility] - Patient verbalized an understanding of the plan of care and agrees.   Raelyn Mora, MSN, CNM 01/26/2020, 10:30 PM

## 2020-01-27 NOTE — Discharge Instructions (Signed)
Your vaginal spotting more than likely came from the excessive vomiting episodes you had over the past few days, because there was no sign of blood on exam tonight.   Return to MAU:  If you have heavier bleeding that soaks through more that 2 pads per hour for an hour or more  If you bleed so much that you feel like you might pass out or you do pass out  If you have significant abdominal pain that is not improved with Tylenol 1000 mg every 6 hours as needed for pain  If you develop a fever > 100.5

## 2020-04-17 NOTE — L&D Delivery Note (Signed)
Delivery Note She progressed to complete and pushed well.  At 3:32 PM a viable female was delivered via Vaginal, Spontaneous (Presentation:  vtx; ROA).  APGAR: 8, 9; weight pending.   Placenta status: Spontaneous, Intact.  Cord: 3 vessels with the following complications: None.   Anesthesia: Epidural;Local Episiotomy: None Lacerations:  2nd degree Suture Repair: 3.0 vicryl rapide Est. Blood Loss (mL):  200  Mom to postpartum.  Baby to Couplet care / Skin to Skin.  Melinda Phillips Melinda Phillips 07/17/2020, 3:51 PM

## 2020-05-25 ENCOUNTER — Other Ambulatory Visit: Payer: Self-pay

## 2020-05-25 ENCOUNTER — Encounter (HOSPITAL_COMMUNITY): Payer: Self-pay | Admitting: Obstetrics and Gynecology

## 2020-05-25 ENCOUNTER — Observation Stay (HOSPITAL_COMMUNITY)
Admission: AD | Admit: 2020-05-25 | Discharge: 2020-05-26 | Disposition: A | Discharge status: Home / Self Care | Payer: 59 | Attending: Obstetrics and Gynecology | Admitting: Obstetrics and Gynecology

## 2020-05-25 DIAGNOSIS — W1830XA Fall on same level, unspecified, initial encounter: Secondary | ICD-10-CM | POA: Diagnosis not present

## 2020-05-25 DIAGNOSIS — Z79899 Other long term (current) drug therapy: Secondary | ICD-10-CM | POA: Insufficient documentation

## 2020-05-25 DIAGNOSIS — Y9302 Activity, running: Secondary | ICD-10-CM | POA: Diagnosis not present

## 2020-05-25 DIAGNOSIS — O0001 Abdominal pregnancy with intrauterine pregnancy: Secondary | ICD-10-CM | POA: Insufficient documentation

## 2020-05-25 DIAGNOSIS — O99891 Other specified diseases and conditions complicating pregnancy: Secondary | ICD-10-CM | POA: Diagnosis not present

## 2020-05-25 DIAGNOSIS — Z3A31 31 weeks gestation of pregnancy: Secondary | ICD-10-CM | POA: Insufficient documentation

## 2020-05-25 DIAGNOSIS — W19XXXA Unspecified fall, initial encounter: Secondary | ICD-10-CM | POA: Diagnosis not present

## 2020-05-25 DIAGNOSIS — S8002XA Contusion of left knee, initial encounter: Secondary | ICD-10-CM | POA: Insufficient documentation

## 2020-05-25 DIAGNOSIS — Z87891 Personal history of nicotine dependence: Secondary | ICD-10-CM | POA: Insufficient documentation

## 2020-05-25 DIAGNOSIS — O9A213 Injury, poisoning and certain other consequences of external causes complicating pregnancy, third trimester: Secondary | ICD-10-CM | POA: Diagnosis present

## 2020-05-25 DIAGNOSIS — R109 Unspecified abdominal pain: Secondary | ICD-10-CM

## 2020-05-25 DIAGNOSIS — S8001XA Contusion of right knee, initial encounter: Secondary | ICD-10-CM | POA: Insufficient documentation

## 2020-05-25 LAB — URINALYSIS, ROUTINE W REFLEX MICROSCOPIC
Bacteria, UA: NONE SEEN
Bilirubin Urine: NEGATIVE
Glucose, UA: 50 mg/dL — AB
Hgb urine dipstick: NEGATIVE
Ketones, ur: NEGATIVE mg/dL
Nitrite: NEGATIVE
Protein, ur: NEGATIVE mg/dL
Specific Gravity, Urine: 1.015 (ref 1.005–1.030)
pH: 7 (ref 5.0–8.0)

## 2020-05-25 LAB — TYPE AND SCREEN
ABO/RH(D): AB POS
Antibody Screen: NEGATIVE

## 2020-05-25 MED ORDER — ACETAMINOPHEN 325 MG PO TABS: 650.0000 mg | ORAL_TABLET | ORAL | Status: DC | PRN

## 2020-05-25 MED ORDER — ZOLPIDEM TARTRATE 5 MG PO TABS: 5.0000 mg | ORAL_TABLET | Freq: Every evening | ORAL | Status: DC | PRN

## 2020-05-25 MED ORDER — CALCIUM CARBONATE ANTACID 500 MG PO CHEW: 2.0000 | CHEWABLE_TABLET | ORAL | Status: DC | PRN

## 2020-05-25 MED ORDER — DOCUSATE SODIUM 100 MG PO CAPS
100.0000 mg | ORAL_CAPSULE | Freq: Every day | ORAL | Status: DC
  Filled 2020-05-25: qty 1

## 2020-05-25 MED ORDER — PRENATAL MULTIVITAMIN CH
1.0000 | ORAL_TABLET | Freq: Every day | ORAL | Status: DC
Start: 2020-05-26 — End: 2020-05-26
  Administered 2020-05-26: 1 via ORAL
  Filled 2020-05-25: qty 1

## 2020-05-25 NOTE — H&P (Signed)
Chief Complaint:  Fall   Event Date/Time   First Provider Initiated Contact with Patient 05/25/20 2205     HPI: Melinda Phillips is a 29 y.o. G2P1001 at 31w3dwho presents to maternity admissions reporting falling tonight at about 1830.  Landed on knees and abdomen.  Has tenderness to palpation on upper and sides of abdomen. No bleeding, Good FM. She reports good fetal movement, denies LOF, vaginal bleeding, vaginal itching/burning, urinary symptoms, h/a, dizziness, n/v, diarrhea, constipation or fever/chills.    Fall The accident occurred 1 to 3 hours ago. The fall occurred while running. She landed on grass. There was no blood loss. The point of impact was the left knee and right knee (abdomen). The pain is present in the left knee and right knee (abdomen/Uterus     Able to bear weight on knees, full ROM). The symptoms are aggravated by pressure on injury. Associated symptoms include abdominal pain. Pertinent negatives include no fever, headaches, loss of consciousness, nausea, numbness, tingling, visual change or vomiting. She has tried nothing for the symptoms.    RN Note: PT SAYS AT 630PM- TONIGHT - SHE WAS RUNNING AFTER DAUGHTER - FELL TO THE GROUND - LANDED ON KNEES AND ABD. FEELS PAIN UNDER RIGHT BREAST - WHICH HAS HAD ALL WEEK .  SO CALLED OFFICE - TOLD TO COME IN .   Past Medical History: Past Medical History:  Diagnosis Date  . History of kidney stones   . Mononucleosis   . Strep throat 05/2015  . Umbilical hernia     Past obstetric history: OB History  Gravida Para Term Preterm AB Living  2 1 1     1  SAB IAB Ectopic Multiple Live Births        0 1    # Outcome Date GA Lbr Len/2nd Weight Sex Delivery Anes PTL Lv  2 Current           1 Term 09/28/15 [redacted]w[redacted]d 16:04 / 04:15 3510 g F Vag-Spont EPI  LIV     Birth Comments: wnl    Past Surgical History: Past Surgical History:  Procedure Laterality Date  . NO PAST SURGERIES    . UMBILICAL HERNIA REPAIR N/A 07/02/2018    Procedure: LAPAROSCOPIC ASSISTED REPAIR OF UMBILICAL AND SUPRAUMBILICAL HERNIA WITH MESH;  Surgeon: Wilson, Eric, MD;  Location: [facility];  Service: General;  Laterality: N/A;    Family History: Family History  Problem Relation Age of Onset  . Hypertension Mother   . Hypertension Father   . Hypertension Maternal Grandmother   . Diabetes Maternal Grandmother   . Hypertension Maternal Grandfather   . Hypertension Paternal Grandmother   . Hypertension Paternal Grandfather   . Heart disease Paternal Grandfather     Social History: Social History   Tobacco Use  . Smoking status: Former Smoker    Types: Cigarettes    Quit date: 09/26/2013    Years since quitting: 6.6  . Smokeless tobacco: Never Used  Vaping Use  . Vaping Use: Never used  Substance Use Topics  . Alcohol use: No  . Drug use: No    Allergies:  Allergies  Allergen Reactions  . Bee Venom Anaphylaxis  . Cinnamon Anaphylaxis    Meds:  Medications Prior to Admission  Medication Sig Dispense Refill Last Dose  . cyclobenzaprine (FLEXERIL) 10 MG tablet Take 1 tablet (10 mg total) by mouth 2 (two) times daily as needed for muscle spasms. 30 tablet 0 Past Month at Unknown time  .   famotidine (PEPCID) 20 MG tablet Take 1 tablet (20 mg total) by mouth 2 (two) times daily. 30 tablet 0 05/25/2020 at Unknown time  . ondansetron (ZOFRAN ODT) 4 MG disintegrating tablet Take 1 tablet (4 mg total) by mouth every 8 (eight) hours as needed for nausea or vomiting. 30 tablet 0 05/24/2020 at Unknown time  . Prenatal Vit-Fe Fumarate-FA (MULTIVITAMIN-PRENATAL) 27-0.8 MG TABS tablet Take 1 tablet by mouth daily at 12 noon.   05/25/2020 at Unknown time  . acetaminophen (TYLENOL) 500 MG tablet Take 1,000 mg by mouth every 6 (six) hours as needed.     . promethazine (PHENERGAN) 25 MG tablet Take 1 tablet (25 mg total) by mouth every 6 (six) hours as needed for nausea or vomiting. 30 tablet 0   . pyridOXINE (VITAMIN B-6) 25 MG tablet Take 25 mg by  mouth 2 (two) times daily.       I have reviewed patient's Past Medical Hx, Surgical Hx, Family Hx, Social Hx, medications and allergies.   ROS:  Review of Systems  Constitutional: Negative for fever.  Gastrointestinal: Positive for abdominal pain. Negative for nausea and vomiting.  Musculoskeletal:       Pain to both knees, but able to bear weight.  Neurological: Negative for tingling, loss of consciousness, numbness and headaches.   Other systems negative  Physical Exam   Patient Vitals for the past 24 hrs:  BP Temp Pulse Resp Height Weight  05/25/20 2121 117/73 98.5 F (36.9 C) 96 20 5' 4" (1.626 m) 81.9 kg   Constitutional: Well-developed, well-nourished female in no acute distress.  Cardiovascular: normal rate and rhythm Respiratory: normal effort, clear to auscultation bilaterally GI: Abd soft, tender over fundus and on sides of uterus, Uterus is gravid and appropriate for gestational age.   No rebound or guarding. MS: Extremities nontender, no edema, normal ROM  Normal ROM of both knees.  No swelling or ecchymosis.  Patellas nontender bilaterally.  Neurologic: Alert and oriented x 4.  GU: Neg CVAT.  PELVIC EXAM: deferred  FHT:  Baseline 125 , moderate variability, accelerations present, no decelerations Contractions: Uterine irritability with occasional contractions   Labs: No results found for this or any previous visit (from the past 24 hour(s)).    Imaging:  No results found.  MAU Course/MDM: I have ordered labs and reviewed results.  NST reviewed and is reassuring.   Consult Dr Constant with presentation, exam findings.  She is recommending overnight observation due to direct blow to abdomen and uterine tenderness. Reported to  Dr Richardson who will follow Treatments in MAU included EFM..    Assessment: Single intrauterine pregnancy at [redacted]w[redacted]d Status post fall with direct blow to abdomen Knee contusions  Plan: Ad[facility] to [facility] for overnight  observation Routine orders Continuous EFM MD to follow in am  Marie Williams CNM, MSN Certified Nurse-Midwife 05/25/2020 10:05 PM 

## 2020-05-25 NOTE — MAU Provider Note (Cosign Needed Addendum)
Chief Complaint:  Fall   Event Date/Time   First Provider Initiated Contact with Patient 05/25/20 2205     HPI: Melinda Phillips is a 30 y.o. G2P1001 at 66w3dwho presents to maternity admissions reporting falling tonight at about 1830.  Landed on knees and abdomen.  Has tenderness to palpation on upper and sides of abdomen. No bleeding, Good FM. She reports good fetal movement, denies LOF, vaginal bleeding, vaginal itching/burning, urinary symptoms, h/a, dizziness, n/v, diarrhea, constipation or fever/chills.    Fall The accident occurred 1 to 3 hours ago. The fall occurred while running. She landed on grass. There was no blood loss. The point of impact was the left knee and right knee (abdomen). The pain is present in the left knee and right knee (abdomen/Uterus     Able to bear weight on knees, full ROM). The symptoms are aggravated by pressure on injury. Associated symptoms include abdominal pain. Pertinent negatives include no fever, headaches, loss of consciousness, nausea, numbness, tingling, visual change or vomiting. She has tried nothing for the symptoms.    RN Note: PT SAYS AT 630PM- TONIGHT - SHE WAS RUNNING AFTER DAUGHTER - FELL TO THE GROUND - LANDED ON KNEES AND ABD. FEELS PAIN UNDER RIGHT BREAST - WHICH HAS HAD ALL WEEK .  SO CALLED OFFICE - TOLD TO COME IN .   Past Medical History: Past Medical History:  Diagnosis Date  . History of kidney stones   . Mononucleosis   . Strep throat 05/2015  . Umbilical hernia     Past obstetric history: OB History  Gravida Para Term Preterm AB Living  2 1 1     1   SAB IAB Ectopic Multiple Live Births        0 1    # Outcome Date GA Lbr Len/2nd Weight Sex Delivery Anes PTL Lv  2 Current           1 Term 09/28/15 [redacted]w[redacted]d 16:04 / 04:15 3510 g F Vag-Spont EPI  LIV     Birth Comments: wnl    Past Surgical History: Past Surgical History:  Procedure Laterality Date  . NO PAST SURGERIES    . UMBILICAL HERNIA REPAIR N/A 07/02/2018    Procedure: LAPAROSCOPIC ASSISTED REPAIR OF UMBILICAL AND SUPRAUMBILICAL HERNIA WITH MESH;  Surgeon: 07/04/2018, MD;  Location: [facility];  Service: General;  Laterality: N/A;    Family History: Family History  Problem Relation Age of Onset  . Hypertension Mother   . Hypertension Father   . Hypertension Maternal Grandmother   . Diabetes Maternal Grandmother   . Hypertension Maternal Grandfather   . Hypertension Paternal Grandmother   . Hypertension Paternal Grandfather   . Heart disease Paternal Grandfather     Social History: Social History   Tobacco Use  . Smoking status: Former Smoker    Types: Cigarettes    Quit date: 09/26/2013    Years since quitting: 6.6  . Smokeless tobacco: Never Used  Vaping Use  . Vaping Use: Never used  Substance Use Topics  . Alcohol use: No  . Drug use: No    Allergies:  Allergies  Allergen Reactions  . Bee Venom Anaphylaxis  . Cinnamon Anaphylaxis    Meds:  Medications Prior to Admission  Medication Sig Dispense Refill Last Dose  . cyclobenzaprine (FLEXERIL) 10 MG tablet Take 1 tablet (10 mg total) by mouth 2 (two) times daily as needed for muscle spasms. 30 tablet 0 Past Month at Unknown time  .  famotidine (PEPCID) 20 MG tablet Take 1 tablet (20 mg total) by mouth 2 (two) times daily. 30 tablet 0 05/25/2020 at Unknown time  . ondansetron (ZOFRAN ODT) 4 MG disintegrating tablet Take 1 tablet (4 mg total) by mouth every 8 (eight) hours as needed for nausea or vomiting. 30 tablet 0 05/24/2020 at Unknown time  . Prenatal Vit-Fe Fumarate-FA (MULTIVITAMIN-PRENATAL) 27-0.8 MG TABS tablet Take 1 tablet by mouth daily at 12 noon.   05/25/2020 at Unknown time  . acetaminophen (TYLENOL) 500 MG tablet Take 1,000 mg by mouth every 6 (six) hours as needed.     . promethazine (PHENERGAN) 25 MG tablet Take 1 tablet (25 mg total) by mouth every 6 (six) hours as needed for nausea or vomiting. 30 tablet 0   . pyridOXINE (VITAMIN B-6) 25 MG tablet Take 25 mg by  mouth 2 (two) times daily.       I have reviewed patient's Past Medical Hx, Surgical Hx, Family Hx, Social Hx, medications and allergies.   ROS:  Review of Systems  Constitutional: Negative for fever.  Gastrointestinal: Positive for abdominal pain. Negative for nausea and vomiting.  Musculoskeletal:       Pain to both knees, but able to bear weight.  Neurological: Negative for tingling, loss of consciousness, numbness and headaches.   Other systems negative  Physical Exam   Patient Vitals for the past 24 hrs:  BP Temp Pulse Resp Height Weight  05/25/20 2121 117/73 98.5 F (36.9 C) 96 20 5\' 4"  (1.626 m) 81.9 kg   Constitutional: Well-developed, well-nourished female in no acute distress.  Cardiovascular: normal rate and rhythm Respiratory: normal effort, clear to auscultation bilaterally GI: Abd soft, tender over fundus and on sides of uterus, Uterus is gravid and appropriate for gestational age.   No rebound or guarding. MS: Extremities nontender, no edema, normal ROM  Normal ROM of both knees.  No swelling or ecchymosis.  Patellas nontender bilaterally.  Neurologic: Alert and oriented x 4.  GU: Neg CVAT.  PELVIC EXAM: deferred  FHT:  Baseline 125 , moderate variability, accelerations present, no decelerations Contractions: Uterine irritability with occasional contractions   Labs: No results found for this or any previous visit (from the past 24 hour(s)).    Imaging:  No results found.  MAU Course/MDM: I have ordered labs and reviewed results.  NST reviewed and is reassuring.   Consult Dr with presentation, exam findings.  She is recommending overnight observation due to direct blow to abdomen and uterine tenderness. Reported to  Dr Jolayne Panther who will follow Treatments in MAU included EFM.Senaida Ores    Assessment: Single intrauterine pregnancy at [redacted]w[redacted]d Status post fall with direct blow to abdomen Knee contusions  Plan: Admit to [facility] for overnight  observation Routine orders Continuous EFM MD to follow in am  [facility] CNM, MSN Certified Nurse-Midwife 05/25/2020 10:05 PM

## 2020-05-25 NOTE — MAU Note (Signed)
PT SAYS AT 630PM- TONIGHT - SHE WAS RUNNING AFTER DAUGHTER - FELL TO THE GROUND - LANDED ON KNEES AND ABD. FEELS PAIN UNDER RIGHT BREAST - WHICH HAS HAD ALL WEEK .  SO CALLED OFFICE - TOLD TO COME IN .

## 2020-05-26 DIAGNOSIS — O9A213 Injury, poisoning and certain other consequences of external causes complicating pregnancy, third trimester: Secondary | ICD-10-CM | POA: Diagnosis not present

## 2020-05-26 NOTE — Progress Notes (Signed)
Discharge instructions and prescriptions given to pt. Discussed signs and symptoms to report to the MD, upcoming appointments, and meds. Pt verbalizes understanding and has no questions or concerns. NST reactive and reassuring. Pt discharged home from hospital in stable condition.

## 2020-05-26 NOTE — Progress Notes (Signed)
Patient ID: Melinda Phillips, female   DOB: 13-Jul-1990, 29 y.o.   MRN: [id number] Pt reports feels well. Stil has mild soreness over fundus when palpated only. She denies cramping, contractions or bleeding. She is still appreciating FMs. No new complaints.  VSS GEN - NAD EFM - cat 1, 120s TOCO - no contractions SVE - deferred  A/P: 29yo G2P1001 female at 105 3/7wks admitted after fall          Pt has been monitored for past 18hrs with cat 1 strip noted         Discussed discharge planning; pt to stay out of work till 05/31/20.          Keep scheduled office visit on 06/02/20.

## 2020-05-26 NOTE — Discharge Instructions (Signed)
Stay out of work till 05/31/20.  Keep scheduled office visit on 06/02/20.

## 2020-05-26 NOTE — Discharge Summary (Signed)
Postpartum Discharge Summary  Date of Service updated      Patient Name: Melinda Phillips DOB: 03-Feb-1991 MRN: [id number]  Date of admission: 05/25/2020 Delivery date:This patient has no babies on file. Delivering provider: This patient has no babies on file. Date of discharge: 05/26/2020  Admitting diagnosis: Fall [W19.XXXA] Intrauterine pregnancy: [redacted]w[redacted]d     Secondary diagnosis:  Active Problems:   Fall  Additional problems: n/a    Discharge diagnosis: s/p fall                                               Hospital course: Pt monitored for 18hrs after had a fall. Cat 1 strip noted. Pt and baby deemed stable with no signs/concerns for abruption noted. Stable for discharge to home   Magnesium Sulfate received: No BMZ received: No Rhophylac:N/A MMR:N/A  Physical exam  Vitals:   05/25/20 2121 05/25/20 2304 05/26/20 0754 05/26/20 1149  BP: 117/73 110/67 110/68 114/71  Pulse: 96 84 91 90  Resp: $Remo'20 18 18 18  'nqVlA$ Temp: 98.5 F (36.9 C) 97.9 F (36.6 C) 97.8 F (36.6 C)   TempSrc:  Oral Oral   SpO2:  100% 100% 100%  Weight: 81.9 kg     Height: $Remove'5\' 4"'FBNXpjP$  (1.626 m)      General: alert, cooperative and no distress Lochia: appropriate Uterine Fundus: firm Incision: N/A DVT Evaluation: No evidence of DVT seen on physical exam. Labs: Lab Results  Component Value Date   WBC 8.2 07/01/2018   HGB 14.0 07/01/2018   HCT 39.9 07/01/2018   MCV 97.1 07/01/2018   PLT 267 07/01/2018   CMP Latest Ref Rng & Units 06/03/2018  Glucose 70 - 99 mg/dL 90  BUN 6 - 20 mg/dL 13  Creatinine 0.44 - 1.00 mg/dL 0.84  Sodium 135 - 145 mmol/L 136  Potassium 3.5 - 5.1 mmol/L 4.4  Chloride 98 - 111 mmol/L 104  CO2 22 - 32 mmol/L 24  Calcium 8.9 - 10.3 mg/dL 8.9  Total Protein 6.5 - 8.1 g/dL 6.9  Total Bilirubin 0.3 - 1.2 mg/dL 1.6(H)  Alkaline Phos 38 - 126 U/L 61  AST 15 - 41 U/L 34  ALT 0 - 44 U/L 17   Edinburgh Score: No flowsheet data found.    After visit meds:  Allergies as of  05/26/2020      Reactions   Bee Venom Anaphylaxis   Cinnamon Anaphylaxis      Medication List    STOP taking these medications   promethazine 25 MG tablet Commonly known as: PHENERGAN     TAKE these medications   ADULT NUTRITIONAL SUPPLEMENT PO Take 1 capsule by mouth daily as needed (for nausea.). Pepto Herbal Blends Ginger and Chamomile   cyclobenzaprine 10 MG tablet Commonly known as: FLEXERIL Take 1 tablet (10 mg total) by mouth 2 (two) times daily as needed for muscle spasms.   famotidine 20 MG tablet Commonly known as: PEPCID Take 1 tablet (20 mg total) by mouth 2 (two) times daily.   loratadine 10 MG tablet Commonly known as: CLARITIN Take 10 mg by mouth daily as needed for allergies.   ondansetron 4 MG disintegrating tablet Commonly known as: Zofran ODT Take 1 tablet (4 mg total) by mouth every 8 (eight) hours as needed for nausea or vomiting.   prenatal multivitamin Tabs tablet Take 1  tablet by mouth daily at 12 noon.   pyridOXINE 25 MG tablet Commonly known as: VITAMIN B-6 Take 25 mg by mouth 2 (two) times daily.        Discharge home in stable condition Activity: Advance as tolerated. Stay out of work till 05/31/20  Diet: routine diet Future Appointments:No future appointments. 06/02/20 with Dr. Wilhelmenia Blase Follow up Visit:  Follow-up Information    Shivaji, Melida Quitter, MD. Go on 06/02/2020.   Specialty: Obstetrics and Gynecology Contact information: [address] [place] [id number] [phone]                   05/26/2020 Isaiah Serge, DO

## 2020-05-26 NOTE — Progress Notes (Signed)
Patient ID: Melinda Phillips, female   DOB: 03-Aug-1990, 30 y.o.   MRN: [id number] As per H&P, Pt is 30 y/o G2P1001 at 31 4/7 weeks who fell about 530pm yesterday evening and hit abdomen on grass.  She came in for monitoring and baby Category 1 wiith no contractions.  SHe has had no bleeding or cramping, but is sore and tender on abdomen where she fell.  Teaching Service MD recommended extended monitoring due to the tenderness.   FHR has been Category 1 all night.  Pt can likely be d/c'd after lunch today if tracing remains normal.

## 2020-06-27 ENCOUNTER — Encounter (HOSPITAL_COMMUNITY): Payer: Self-pay | Admitting: Obstetrics and Gynecology

## 2020-06-27 ENCOUNTER — Inpatient Hospital Stay (HOSPITAL_COMMUNITY)
Admission: AD | Admit: 2020-06-27 | Discharge: 2020-06-27 | Disposition: A | Discharge status: Home / Self Care | Payer: 59 | Attending: Obstetrics and Gynecology | Admitting: Obstetrics and Gynecology

## 2020-06-27 ENCOUNTER — Other Ambulatory Visit: Payer: Self-pay

## 2020-06-27 DIAGNOSIS — Z3A36 36 weeks gestation of pregnancy: Secondary | ICD-10-CM | POA: Insufficient documentation

## 2020-06-27 DIAGNOSIS — Z87442 Personal history of urinary calculi: Secondary | ICD-10-CM | POA: Insufficient documentation

## 2020-06-27 DIAGNOSIS — O98813 Other maternal infectious and parasitic diseases complicating pregnancy, third trimester: Secondary | ICD-10-CM | POA: Diagnosis not present

## 2020-06-27 DIAGNOSIS — O98513 Other viral diseases complicating pregnancy, third trimester: Secondary | ICD-10-CM | POA: Diagnosis not present

## 2020-06-27 DIAGNOSIS — Z87891 Personal history of nicotine dependence: Secondary | ICD-10-CM | POA: Insufficient documentation

## 2020-06-27 DIAGNOSIS — O212 Late vomiting of pregnancy: Secondary | ICD-10-CM | POA: Insufficient documentation

## 2020-06-27 DIAGNOSIS — Z91030 Bee allergy status: Secondary | ICD-10-CM | POA: Diagnosis not present

## 2020-06-27 DIAGNOSIS — A084 Viral intestinal infection, unspecified: Secondary | ICD-10-CM | POA: Diagnosis not present

## 2020-06-27 LAB — COMPREHENSIVE METABOLIC PANEL
ALT: 22 U/L (ref 0–44)
AST: 24 U/L (ref 15–41)
Albumin: 2.6 g/dL — ABNORMAL LOW (ref 3.5–5.0)
Alkaline Phosphatase: 128 U/L — ABNORMAL HIGH (ref 38–126)
Anion gap: 13 (ref 5–15)
BUN: 5 mg/dL — ABNORMAL LOW (ref 6–20)
CO2: 19 mmol/L — ABNORMAL LOW (ref 22–32)
Calcium: 8.6 mg/dL — ABNORMAL LOW (ref 8.9–10.3)
Chloride: 106 mmol/L (ref 98–111)
Creatinine, Ser: 0.59 mg/dL (ref 0.44–1.00)
GFR, Estimated: >60 mL/min (ref >60)
Glucose, Bld: 80 mg/dL (ref 70–99)
Potassium: 3.3 mmol/L — ABNORMAL LOW (ref 3.5–5.1)
Sodium: 138 mmol/L (ref 135–145)
Total Bilirubin: 1.9 mg/dL — ABNORMAL HIGH (ref 0.3–1.2)
Total Protein: 6.3 g/dL — ABNORMAL LOW (ref 6.5–8.1)

## 2020-06-27 LAB — URINALYSIS, ROUTINE W REFLEX MICROSCOPIC
Bacteria, UA: NONE SEEN
Bilirubin Urine: NEGATIVE
Glucose, UA: NEGATIVE mg/dL
Hgb urine dipstick: NEGATIVE
Ketones, ur: 80 mg/dL — AB
Nitrite: NEGATIVE
Protein, ur: 30 mg/dL — AB
Specific Gravity, Urine: 1.02 (ref 1.005–1.030)
pH: 6 (ref 5.0–8.0)

## 2020-06-27 LAB — CBC
HCT: 34.5 % — ABNORMAL LOW (ref 36.0–46.0)
Hemoglobin: 12.2 g/dL (ref 12.0–15.0)
MCH: 33.2 pg (ref 26.0–34.0)
MCHC: 35.4 g/dL (ref 30.0–36.0)
MCV: 93.8 fL (ref 80.0–100.0)
Platelets: 211 10*3/uL (ref 150–400)
RBC: 3.68 MIL/uL — ABNORMAL LOW (ref 3.87–5.11)
RDW: 12.7 % (ref 11.5–15.5)
WBC: 13.8 10*3/uL — ABNORMAL HIGH (ref 4.0–10.5)
nRBC: 0 % (ref 0.0–0.2)

## 2020-06-27 MED ORDER — LACTATED RINGERS IV BOLUS
1000.0000 mL | Freq: Once | INTRAVENOUS | Status: AC
  Administered 2020-06-27: 1000 mL via INTRAVENOUS

## 2020-06-27 MED ORDER — M.V.I. ADULT IV INJ
Freq: Once | INTRAVENOUS | Status: AC
  Filled 2020-06-27: qty 10

## 2020-06-27 MED ORDER — PROMETHAZINE HCL 25 MG/ML IJ SOLN
25.0000 mg | Freq: Once | INTRAMUSCULAR | Status: AC
  Administered 2020-06-27: 25 mg via INTRAVENOUS
  Filled 2020-06-27: qty 1

## 2020-06-27 MED ORDER — PROMETHAZINE HCL 25 MG PO TABS: 25.0000 mg | ORAL_TABLET | Freq: Four times a day (QID) | ORAL | 0 refills | Status: DC | PRN

## 2020-06-27 NOTE — MAU Note (Addendum)
Pt reports to mau with c/o nausea and vomiting since 0700 today.  Pt reports her 28 year old daughter had a stomach virus on Friday.  Pt denies diarrhea.  Denies LOF or ctx.  Pt reports feeling dizzy and lightheaded as well.  +FM.  Pt states she has taken 3 doses of zofran today with no relief. Last dose around 1400.

## 2020-06-27 NOTE — MAU Provider Note (Addendum)
History     CSN: [id number]  Arrival date and time: 06/27/20 1628   Event Date/Time   First Provider Initiated Contact with Patient 06/27/20 1703      Chief Complaint  Patient presents with   Emesis   Nausea   HPI Melinda Phillips is a 30 y.o. G2P1001 at [redacted]w[redacted]d who presents to MAU with chief complaint of vomiting. Patient states her daughter is just getting over a GI bug. Patient experienced new onset recurrent vomiting at 0400 this morning. She has an active prescription for Zofran but did not experience relief. She also endorses new onset dizziness during and immediately after episodes of vomiting. She denies vaginal bleeding, leaking of fluid, decreased fetal movement, fever, falls, or recent illness.    She receives care with [facility].   OB History     Gravida  2   Para  1   Term  1   Preterm      AB      Living  1      SAB      IAB      Ectopic      Multiple  0   Live Births  1           Past Medical History:  Diagnosis Date   History of kidney stones    Mononucleosis    Strep throat 05/2015   Umbilical hernia     Past Surgical History:  Procedure Laterality Date   NO PAST SURGERIES     UMBILICAL HERNIA REPAIR N/A 07/02/2018   Procedure: LAPAROSCOPIC ASSISTED REPAIR OF UMBILICAL AND SUPRAUMBILICAL HERNIA WITH MESH;  Surgeon: Gaynelle Adu, MD;  Location: [facility];  Service: General;  Laterality: N/A;    Family History  Problem Relation Age of Onset   Hypertension Mother    Hypertension Father    Hypertension Maternal Grandmother    Diabetes Maternal Grandmother    Hypertension Maternal Grandfather    Hypertension Paternal Grandmother    Hypertension Paternal Grandfather    Heart disease Paternal Grandfather     Social History   Tobacco Use   Smoking status: Former Smoker    Types: Cigarettes    Quit date: 09/26/2013    Years since quitting: 6.7   Smokeless tobacco: Never Used  Vaping Use   Vaping Use: Never used  Substance  Use Topics   Alcohol use: No   Drug use: No    Allergies:  Allergies  Allergen Reactions   Bee Venom Anaphylaxis   Cinnamon Anaphylaxis    No medications prior to admission.    Review of Systems  Gastrointestinal: Positive for nausea and vomiting.  Neurological: Positive for dizziness.  All other systems reviewed and are negative.  Physical Exam   Blood pressure (!) 114/57, pulse (!) 102, temperature 98.2 F (36.8 C), temperature source Oral, resp. rate 18, last menstrual period 10/18/2019, SpO2 98 %, unknown if currently breastfeeding.  Physical Exam Vitals and nursing note reviewed. Exam conducted with a chaperone present.  Constitutional:      Appearance: Normal appearance.  Cardiovascular:     Rate and Rhythm: Tachycardia present.     Pulses: Normal pulses.  Pulmonary:     Effort: Pulmonary effort is normal.     Breath sounds: Normal breath sounds.  Abdominal:     Comments: Gravid  Skin:    Capillary Refill: Capillary refill takes less than 2 seconds.  Neurological:     Mental Status: She is alert and oriented  to person, place, and time.  Psychiatric:        Behavior: Behavior normal.    MAU Course  Procedures Results for orders placed or performed during the hospital encounter of 06/27/20 (from the past 24 hour(s))  Urinalysis, Routine w reflex microscopic Urine, Clean Catch     Status: Abnormal   Collection Time: 06/27/20  4:45 PM  Result Value Ref Range   Color, Urine AMBER (A) YELLOW   APPearance CLOUDY (A) CLEAR   Specific Gravity, Urine 1.020 1.005 - 1.030   pH 6.0 5.0 - 8.0   Glucose, UA NEGATIVE NEGATIVE mg/dL   Hgb urine dipstick NEGATIVE NEGATIVE   Bilirubin Urine NEGATIVE NEGATIVE   Ketones, ur 80 (A) NEGATIVE mg/dL   Protein, ur 30 (A) NEGATIVE mg/dL   Nitrite NEGATIVE NEGATIVE   Leukocytes,Ua SMALL (A) NEGATIVE   RBC / HPF 0-5 0 - 5 RBC/hpf   WBC, UA 11-20 0 - 5 WBC/hpf   Bacteria, UA NONE SEEN NONE SEEN   Squamous Epithelial / LPF  21-50 0 - 5   Mucus PRESENT   CBC     Status: Abnormal   Collection Time: 06/27/20  6:20 PM  Result Value Ref Range   WBC 13.8 (H) 4.0 - 10.5 K/uL   RBC 3.68 (L) 3.87 - 5.11 MIL/uL   Hemoglobin 12.2 12.0 - 15.0 g/dL   HCT 16.1 (L) 09.6 - 04.5 %   MCV 93.8 80.0 - 100.0 fL   MCH 33.2 26.0 - 34.0 pg   MCHC 35.4 30.0 - 36.0 g/dL   RDW 40.9 81.1 - 91.4 %   Platelets 211 150 - 400 K/uL   nRBC 0.0 0.0 - 0.2 %  Comprehensive metabolic panel     Status: Abnormal   Collection Time: 06/27/20  6:20 PM  Result Value Ref Range   Sodium 138 135 - 145 mmol/L   Potassium 3.3 (L) 3.5 - 5.1 mmol/L   Chloride 106 98 - 111 mmol/L   CO2 19 (L) 22 - 32 mmol/L   Glucose, Bld 80 70 - 99 mg/dL   BUN 5 (L) 6 - 20 mg/dL   Creatinine, Ser 7.82 0.44 - 1.00 mg/dL   Calcium 8.6 (L) 8.9 - 10.3 mg/dL   Total Protein 6.3 (L) 6.5 - 8.1 g/dL   Albumin 2.6 (L) 3.5 - 5.0 g/dL   AST 24 15 - 41 U/L   ALT 22 0 - 44 U/L   Alkaline Phosphatase 128 (H) 38 - 126 U/L   Total Bilirubin 1.9 (H) 0.3 - 1.2 mg/dL   GFR, Estimated >95 >62 mL/min   Anion gap 13 5 - 15    --Reactive tracing: baseline 150, mod var, + 15 x 15 accels, no decels --Toco: UI  Orders Placed This Encounter  Procedures   Urinalysis, Routine w reflex microscopic Urine, Clean Catch   CBC   Comprehensive metabolic panel   Blood draw with IV start   Discharge patient   Meds ordered this encounter  Medications   lactated ringers bolus 1,000 mL   promethazine (PHENERGAN) injection 25 mg   multivitamins adult (INFUVITE ADULT) 10 mL in lactated ringers 1,000 mL infusion   promethazine (PHENERGAN) 25 MG tablet    Sig: Take 1 tablet (25 mg total) by mouth every 6 (six) hours as needed for nausea or vomiting.    Dispense:  30 tablet    Refill:  0    Order Specific Question:   Supervising Provider  Answer:   Duane Lope H [[id number]]   Report given to C. Druscilla Brownie, CNM who assumes care of patient at this time.  Clayton Bibles, MSN, CNM Certified  Nurse Midwife, [facility] for [employer], [facility] 06/27/20 7:53 PM   Patient reports improvement of nausea and vomiting after medications and IV fluids. Able to tolerate PO fluids and crackers  Assessment and Plan   1. Viral gastroenteritis   2. [redacted] weeks gestation of pregnancy    -Discharge home in stable condition -Viral GI precautions discussed -Patient advised to follow-up with OB as scheduled for prenatal care -Patient may return to MAU as needed or if her condition were to change or worsen  Rolm Bookbinder, CNM 06/27/20 9:44 PM

## 2020-06-27 NOTE — Discharge Instructions (Signed)

## 2020-06-30 LAB — OB RESULTS CONSOLE GBS: GBS: NEGATIVE

## 2020-07-08 ENCOUNTER — Telehealth (HOSPITAL_COMMUNITY): Payer: Self-pay | Admitting: *Deleted

## 2020-07-08 NOTE — Telephone Encounter (Signed)
Preadmission screen  

## 2020-07-09 ENCOUNTER — Encounter (HOSPITAL_COMMUNITY): Payer: Self-pay | Admitting: *Deleted

## 2020-07-13 ENCOUNTER — Other Ambulatory Visit: Payer: Self-pay | Admitting: Obstetrics and Gynecology

## 2020-07-17 ENCOUNTER — Inpatient Hospital Stay (HOSPITAL_COMMUNITY)
Admission: AD | Admit: 2020-07-17 | Discharge: 2020-07-19 | DRG: 807 | Disposition: A | Discharge status: Home / Self Care | Payer: 59 | Attending: Obstetrics and Gynecology | Admitting: Obstetrics and Gynecology

## 2020-07-17 ENCOUNTER — Inpatient Hospital Stay (HOSPITAL_COMMUNITY): Payer: 59 | Admitting: Anesthesiology

## 2020-07-17 ENCOUNTER — Encounter (HOSPITAL_COMMUNITY): Payer: Self-pay | Admitting: Obstetrics and Gynecology

## 2020-07-17 ENCOUNTER — Other Ambulatory Visit: Payer: Self-pay

## 2020-07-17 DIAGNOSIS — Z20822 Contact with and (suspected) exposure to covid-19: Secondary | ICD-10-CM | POA: Diagnosis present

## 2020-07-17 DIAGNOSIS — O4292 Full-term premature rupture of membranes, unspecified as to length of time between rupture and onset of labor: Secondary | ICD-10-CM | POA: Diagnosis present

## 2020-07-17 DIAGNOSIS — O26893 Other specified pregnancy related conditions, third trimester: Secondary | ICD-10-CM | POA: Diagnosis present

## 2020-07-17 DIAGNOSIS — Z3A39 39 weeks gestation of pregnancy: Secondary | ICD-10-CM | POA: Diagnosis not present

## 2020-07-17 DIAGNOSIS — Z87891 Personal history of nicotine dependence: Secondary | ICD-10-CM | POA: Diagnosis not present

## 2020-07-17 LAB — RESP PANEL BY RT-PCR (FLU A&B, COVID) ARPGX2
Influenza A by PCR: NEGATIVE
Influenza B by PCR: NEGATIVE
SARS Coronavirus 2 by RT PCR: NEGATIVE

## 2020-07-17 LAB — CBC
HCT: 31 % — ABNORMAL LOW (ref 36.0–46.0)
Hemoglobin: 10.3 g/dL — ABNORMAL LOW (ref 12.0–15.0)
MCH: 30.6 pg (ref 26.0–34.0)
MCHC: 33.2 g/dL (ref 30.0–36.0)
MCV: 92 fL (ref 80.0–100.0)
Platelets: 220 10*3/uL (ref 150–400)
RBC: 3.37 MIL/uL — ABNORMAL LOW (ref 3.87–5.11)
RDW: 12.6 % (ref 11.5–15.5)
WBC: 12.7 10*3/uL — ABNORMAL HIGH (ref 4.0–10.5)
nRBC: 0 % (ref 0.0–0.2)

## 2020-07-17 LAB — RPR: RPR Ser Ql: NONREACTIVE

## 2020-07-17 LAB — TYPE AND SCREEN
ABO/RH(D): AB POS
Antibody Screen: NEGATIVE

## 2020-07-17 MED ORDER — MISOPROSTOL 25 MCG QUARTER TABLET
25.0000 ug | ORAL_TABLET | ORAL | Status: DC | PRN
  Administered 2020-07-17 (×2): 25 ug via VAGINAL
  Filled 2020-07-17 (×2): qty 1

## 2020-07-17 MED ORDER — LACTATED RINGERS IV SOLN: 500.0000 mL | Freq: Once | INTRAVENOUS | Status: DC

## 2020-07-17 MED ORDER — ONDANSETRON HCL 4 MG PO TABS: 4.0000 mg | ORAL_TABLET | ORAL | Status: DC | PRN

## 2020-07-17 MED ORDER — BUPIVACAINE HCL (PF) 0.25 % IJ SOLN
INTRAMUSCULAR | Status: DC | PRN
  Administered 2020-07-17 (×2): 8 mL via EPIDURAL

## 2020-07-17 MED ORDER — PHENYLEPHRINE 40 MCG/ML (10ML) SYRINGE FOR IV PUSH (FOR BLOOD PRESSURE SUPPORT): 80.0000 ug | PREFILLED_SYRINGE | INTRAVENOUS | Status: DC | PRN

## 2020-07-17 MED ORDER — SIMETHICONE 80 MG PO CHEW
80.0000 mg | CHEWABLE_TABLET | ORAL | Status: DC | PRN
Start: 2020-07-17 — End: 2020-07-19

## 2020-07-17 MED ORDER — DIPHENHYDRAMINE HCL 50 MG/ML IJ SOLN: 12.5000 mg | INTRAMUSCULAR | Status: DC | PRN

## 2020-07-17 MED ORDER — OXYTOCIN-SODIUM CHLORIDE 30-0.9 UT/500ML-% IV SOLN: 2.5000 [IU]/h | INTRAVENOUS | Status: DC

## 2020-07-17 MED ORDER — OXYTOCIN-SODIUM CHLORIDE 30-0.9 UT/500ML-% IV SOLN: 1.0000 m[IU]/min | INTRAVENOUS | Status: DC

## 2020-07-17 MED ORDER — LACTATED RINGERS IV SOLN: 500.0000 mL | INTRAVENOUS | Status: DC | PRN

## 2020-07-17 MED ORDER — EPHEDRINE 5 MG/ML INJ: 10.0000 mg | INTRAVENOUS | Status: DC | PRN

## 2020-07-17 MED ORDER — DIPHENHYDRAMINE HCL 25 MG PO CAPS: 25.0000 mg | ORAL_CAPSULE | Freq: Four times a day (QID) | ORAL | Status: DC | PRN

## 2020-07-17 MED ORDER — ZOLPIDEM TARTRATE 5 MG PO TABS: 5.0000 mg | ORAL_TABLET | Freq: Every evening | ORAL | Status: DC | PRN

## 2020-07-17 MED ORDER — OXYCODONE HCL 5 MG PO TABS: 10.0000 mg | ORAL_TABLET | ORAL | Status: DC | PRN

## 2020-07-17 MED ORDER — LACTATED RINGERS IV SOLN: INTRAVENOUS | Status: DC

## 2020-07-17 MED ORDER — BUTORPHANOL TARTRATE 1 MG/ML IJ SOLN: 1.0000 mg | INTRAMUSCULAR | Status: DC | PRN

## 2020-07-17 MED ORDER — MAGNESIUM HYDROXIDE 400 MG/5ML PO SUSP: 30.0000 mL | ORAL | Status: DC | PRN

## 2020-07-17 MED ORDER — TETANUS-DIPHTH-ACELL PERTUSSIS 5-2.5-18.5 LF-MCG/0.5 IM SUSY: 0.5000 mL | PREFILLED_SYRINGE | Freq: Once | INTRAMUSCULAR | Status: DC

## 2020-07-17 MED ORDER — METHYLERGONOVINE MALEATE 0.2 MG PO TABS: 0.2000 mg | ORAL_TABLET | ORAL | Status: DC | PRN

## 2020-07-17 MED ORDER — TERBUTALINE SULFATE 1 MG/ML IJ SOLN: 0.2500 mg | Freq: Once | INTRAMUSCULAR | Status: DC | PRN

## 2020-07-17 MED ORDER — OXYTOCIN BOLUS FROM INFUSION
333.0000 mL | Freq: Once | INTRAVENOUS | Status: AC
  Administered 2020-07-17: 333 mL via INTRAVENOUS

## 2020-07-17 MED ORDER — METHYLERGONOVINE MALEATE 0.2 MG/ML IJ SOLN: 0.2000 mg | INTRAMUSCULAR | Status: DC | PRN

## 2020-07-17 MED ORDER — LIDOCAINE HCL (PF) 1 % IJ SOLN
30.0000 mL | INTRAMUSCULAR | Status: AC | PRN
  Administered 2020-07-17: 30 mL via SUBCUTANEOUS
  Filled 2020-07-17: qty 30

## 2020-07-17 MED ORDER — BENZOCAINE-MENTHOL 20-0.5 % EX AERO: 1.0000 "application " | INHALATION_SPRAY | CUTANEOUS | Status: DC | PRN

## 2020-07-17 MED ORDER — ACETAMINOPHEN 325 MG PO TABS: 650.0000 mg | ORAL_TABLET | ORAL | Status: DC | PRN

## 2020-07-17 MED ORDER — OXYCODONE-ACETAMINOPHEN 5-325 MG PO TABS: 2.0000 | ORAL_TABLET | ORAL | Status: DC | PRN

## 2020-07-17 MED ORDER — DIBUCAINE (PERIANAL) 1 % EX OINT: 1.0000 "application " | TOPICAL_OINTMENT | CUTANEOUS | Status: DC | PRN

## 2020-07-17 MED ORDER — ONDANSETRON HCL 4 MG/2ML IJ SOLN: 4.0000 mg | INTRAMUSCULAR | Status: DC | PRN

## 2020-07-17 MED ORDER — MEASLES, MUMPS & RUBELLA VAC IJ SOLR: 0.5000 mL | Freq: Once | INTRAMUSCULAR | Status: DC

## 2020-07-17 MED ORDER — COCONUT OIL OIL: 1.0000 "application " | TOPICAL_OIL | Status: DC | PRN

## 2020-07-17 MED ORDER — IBUPROFEN 600 MG PO TABS
600.0000 mg | ORAL_TABLET | Freq: Four times a day (QID) | ORAL | Status: DC
  Administered 2020-07-17 – 2020-07-19 (×7): 600 mg via ORAL
  Filled 2020-07-17 (×7): qty 1

## 2020-07-17 MED ORDER — OXYCODONE-ACETAMINOPHEN 5-325 MG PO TABS: 1.0000 | ORAL_TABLET | ORAL | Status: DC | PRN

## 2020-07-17 MED ORDER — WITCH HAZEL-GLYCERIN EX PADS: 1.0000 "application " | MEDICATED_PAD | CUTANEOUS | Status: DC | PRN

## 2020-07-17 MED ORDER — ONDANSETRON HCL 4 MG/2ML IJ SOLN
4.0000 mg | Freq: Four times a day (QID) | INTRAMUSCULAR | Status: DC | PRN
  Administered 2020-07-17: 4 mg via INTRAVENOUS
  Filled 2020-07-17: qty 2

## 2020-07-17 MED ORDER — SOD CITRATE-CITRIC ACID 500-334 MG/5ML PO SOLN: 30.0000 mL | ORAL | Status: DC | PRN

## 2020-07-17 MED ORDER — PRENATAL MULTIVITAMIN CH
1.0000 | ORAL_TABLET | Freq: Every day | ORAL | Status: DC
  Administered 2020-07-18: 1 via ORAL
  Filled 2020-07-17: qty 1

## 2020-07-17 MED ORDER — OXYCODONE HCL 5 MG PO TABS: 5.0000 mg | ORAL_TABLET | ORAL | Status: DC | PRN

## 2020-07-17 MED ORDER — FENTANYL-BUPIVACAINE-NACL 0.5-0.125-0.9 MG/250ML-% EP SOLN
12.0000 mL/h | EPIDURAL | Status: DC | PRN
  Administered 2020-07-17: 14 mL/h via EPIDURAL
  Filled 2020-07-17: qty 250

## 2020-07-17 MED ORDER — OXYTOCIN-SODIUM CHLORIDE 30-0.9 UT/500ML-% IV SOLN
1.0000 m[IU]/min | INTRAVENOUS | Status: DC
  Administered 2020-07-17: 2 m[IU]/min via INTRAVENOUS
  Filled 2020-07-17: qty 500

## 2020-07-17 MED ORDER — SENNOSIDES-DOCUSATE SODIUM 8.6-50 MG PO TABS
2.0000 | ORAL_TABLET | Freq: Every day | ORAL | Status: DC
  Administered 2020-07-18: 2 via ORAL
  Filled 2020-07-17: qty 2

## 2020-07-17 MED ORDER — LIDOCAINE HCL (PF) 1 % IJ SOLN
INTRAMUSCULAR | Status: DC | PRN
  Administered 2020-07-17: 8 mL via EPIDURAL

## 2020-07-17 NOTE — H&P (Signed)
Melinda Phillips is a 30 y.o. female, G2 P1001, EGA [redacted] weeks with EDC 4-9 presenting for leaking fluid since 2235 last pm.  On eval in MAU, ROM confirmed with + fern, VE 2 cm.  PNC essentially uncomplicated.  OB History    Gravida  2   Para  1   Term  1   Preterm      AB      Living  1     SAB      IAB      Ectopic      Multiple  0   Live Births  1          Past Medical History:  Diagnosis Date  . History of kidney stones   . Mononucleosis   . Strep throat 05/2015  . Umbilical hernia    Past Surgical History:  Procedure Laterality Date  . NO PAST SURGERIES    . UMBILICAL HERNIA REPAIR N/A 07/02/2018   Procedure: LAPAROSCOPIC ASSISTED REPAIR OF UMBILICAL AND SUPRAUMBILICAL HERNIA WITH MESH;  Surgeon: Gaynelle Adu, MD;  Location: [facility];  Service: General;  Laterality: N/A;   Family History: family history includes Diabetes in her father, maternal grandmother, and paternal grandfather; Heart attack in her father, maternal grandmother, and paternal grandfather; Heart disease in her father and paternal grandfather; Hyperlipidemia in her father; Hypertension in her father, maternal grandfather, maternal grandmother, mother, paternal grandfather, and paternal grandmother; Stroke in her maternal grandmother. Social History:  reports that she quit smoking about 6 years ago. Her smoking use included cigarettes. She has never used smokeless tobacco. She reports that she does not drink alcohol and does not use drugs.     Maternal Diabetes: No Genetic Screening: Declined Maternal Ultrasounds/Referrals: Normal Fetal Ultrasounds or other Referrals:  None Maternal Substance Abuse:  No Significant Maternal Medications:  None Significant Maternal Lab Results:  Group B Strep negative Other Comments:  None  Review of Systems  Respiratory: Negative.   Cardiovascular: Negative.    Maternal Medical History:  Reason for admission: Rupture of membranes.   Contractions:  Frequency: irregular.   Perceived severity is moderate.    Fetal activity: Perceived fetal activity is normal.    Prenatal complications: no prenatal complications Prenatal Complications - Diabetes: none.    Dilation: 2 Effacement (%): Thick Station: -2 Exam by:: Varney Biles, RN Blood pressure (!) 104/57, pulse 85, temperature 98.4 F (36.9 C), temperature source Axillary, resp. rate 18, height 5\' 4"  (1.626 m), weight 85.5 kg, last menstrual period 10/18/2019, SpO2 99 %, unknown if currently breastfeeding. Maternal Exam:  Uterine Assessment: Contraction strength is moderate.  Contraction frequency is irregular.   Abdomen: Patient reports no abdominal tenderness. Estimated fetal weight is 8 lbs.   Fetal presentation: vertex  Introitus: Normal vulva. Normal vagina.  Ferning test: positive.  Amniotic fluid character: clear.  Pelvis: adequate for delivery.      Fetal Exam Fetal Monitor Review: Mode: ultrasound.   Baseline rate: 110.  Variability: moderate (6-25 bpm).   Pattern: accelerations present and no decelerations.    Fetal State Assessment: Category I - tracings are normal.     Physical Exam Vitals reviewed.  Constitutional:      Appearance: Normal appearance.  Cardiovascular:     Rate and Rhythm: Normal rate and regular rhythm.  Pulmonary:     Effort: Pulmonary effort is normal. No respiratory distress.  Abdominal:     Palpations: Abdomen is soft.  Genitourinary:    General: Normal vulva.  Neurological:     Mental Status: She is alert.     Prenatal labs: ABO, Rh: --/--/AB POS (04/02 0136) Antibody: NEG (04/02 0136) Rubella: Immune (09/08 0000) RPR: Nonreactive (09/08 0000)  HBsAg: Negative (09/08 0000)  HIV: Non-reactive (09/08 0000)  GBS: Negative/-- (03/16 0000)   Assessment/Plan: IUP at 39 weeks with PROM.  She was admitted early this am, has received an epidural and is comfortable.  When I gave verbal admission orders, I gave orders for  pitocin.  However, the nurses used the admission orders previously put in by Dr. Reina Fuse for cervical ripening so she has received 2 doses of cytotec instead of pitocin.  Will start pitocin at 1100 which will be 4 hours from last cytotec   Zenaida Niece 07/17/2020, 9:34 AM

## 2020-07-17 NOTE — Anesthesia Procedure Notes (Signed)
Epidural Patient location during procedure: OB  Staffing Anesthesiologist: Skylar Flynt, Candra R, MD Performed: anesthesiologist   Preanesthetic Checklist Completed: patient identified, IV checked, site marked, risks and benefits discussed, monitors and equipment checked, pre-op evaluation and timeout performed  Epidural Patient position: sitting Prep: DuraPrep Patient monitoring: heart rate, cardiac monitor, continuous pulse ox and blood pressure Approach: midline Location: L2-L3 Injection technique: LOR saline  Needle:  Needle type: Tuohy  Needle gauge: 17 G Needle length: 9 cm Needle insertion depth: 7 cm Catheter type: closed end flexible Catheter size: 20 Guage Catheter at skin depth: 12 cm Test dose: negative and Other  Assessment Events: blood not aspirated, injection not painful, no injection resistance and negative IV test  Additional Notes Informed consent obtained prior to proceeding including risk of failure, 1% risk of PDPH, risk of minor discomfort and bruising.  Discussed rare but serious complications including epidural abscess, permanent nerve injury, epidural hematoma.  Discussed alternatives to epidural analgesia and patient desires to proceed.  Timeout performed pre-procedure verifying patient name, procedure, and platelet count.  Patient tolerated procedure well.      

## 2020-07-17 NOTE — Progress Notes (Signed)
Comfortable with epidural Afeb, VSS FHT-110-120, Cat I, ctx q 3-4 min on 4 mu/min pitocin VE-4/70/-1, vtx, IUPC placed Will continue pitocin, monitor progress, anticipate SVD

## 2020-07-17 NOTE — MAU Provider Note (Signed)
S: Ms. Melinda Phillips is a 30 y.o. G2P1001 at [redacted]w[redacted]d  who presents to MAU today complaining of leaking of fluid since 1035pm. She endorses vaginal bleeding. She endorses contractions. She reports normal fetal movement.  Patient receives care at [facility].   O: BP 128/83 (BP Location: Right Arm)   Pulse 92   Temp 97.9 F (36.6 C) (Oral)   Resp 18   Ht 5\' 4"  (1.626 m)   Wt 85.5 kg   LMP 10/18/2019   SpO2 98%   BMI 32.36 kg/m  GENERAL: Well-developed, well-nourished female in no acute distress.  HEAD: Normocephalic, atraumatic.  CHEST: Normal effort of breathing, regular heart rate ABDOMEN: Soft, nontender, gravid PELVIC: Normal external female genitalia. Vagina is pink and rugated. Cervix with normal contour, no lesions. Normal discharge.  + pooling. Fern Collected.  Cervical exam:  Dilation: 1 Effacement (%): Thick Station: -3 Exam by:: [email or web address] RNC   Fetal Monitoring: FHT: 120 bpm, Mod Var, -Decels, +Accels Toco: Irregular  No results found for this or any previous visit (from the past 24 hour(s)).   A: SIUP at [redacted]w[redacted]d  SROM Cat I FT  P: Report given to RN to contact MD on call for admission orders  [redacted]w[redacted]d, CNM 07/17/2020 1:40 AM

## 2020-07-17 NOTE — Anesthesia Preprocedure Evaluation (Signed)
Anesthesia Evaluation  Patient identified by MRN, date of birth, ID band Patient awake    Reviewed: Allergy & Precautions, NPO status , Patient's Chart, lab work & pertinent test results  Airway Mallampati: II  TM Distance: >3 FB Neck ROM: Full    Dental no notable dental hx.    Pulmonary neg pulmonary ROS, former smoker,    Pulmonary exam normal breath sounds clear to auscultation       Cardiovascular negative cardio ROS Normal cardiovascular exam Rhythm:Regular Rate:Normal     Neuro/Psych negative neurological ROS  negative psych ROS   GI/Hepatic negative GI ROS, Neg liver ROS,   Endo/Other  negative endocrine ROS  Renal/GU negative Renal ROS  negative genitourinary   Musculoskeletal negative musculoskeletal ROS (+)   Abdominal   Peds negative pediatric ROS (+)  Hematology  (+) anemia ,   Anesthesia Other Findings   Reproductive/Obstetrics (+) Pregnancy                             Anesthesia Physical Anesthesia Plan  ASA: II  Anesthesia Plan: Epidural   Post-op Pain Management:    Induction:   PONV Risk Score and Plan: 2 and Treatment may vary due to age or medical condition  Airway Management Planned: Natural Airway  Additional Equipment:   Intra-op Plan:   Post-operative Plan:   Informed Consent: I have reviewed the patients History and Physical, chart, labs and discussed the procedure including the risks, benefits and alternatives for the proposed anesthesia with the patient or authorized representative who has indicated his/her understanding and acceptance.       Plan Discussed with: Anesthesiologist  Anesthesia Plan Comments:         Anesthesia Quick Evaluation

## 2020-07-17 NOTE — MAU Note (Signed)
.  Melinda Phillips is a 30 y.o. at [redacted]w[redacted]d here in MAU reporting: Leaking fluid that looks like thick tacky milky discharge. Reports occasional contractions. Denies vaginal bleeding. +FM  Pain score: 6/10 Vitals:   07/17/20 0026  BP: 127/85  Pulse: 88  Resp: 17  Temp: 97.9 F (36.6 C)  SpO2: 98%     FHT: 125

## 2020-07-18 NOTE — Progress Notes (Addendum)
RN has monitored pt's leg function post-epidural and has had to use the stedy x3 to transfer pt to the restroom. 11 hours PP, pt is still experiencing weakness, pins & needles feeling in her right upper thigh region. RN notified OB anesthesia and was told they would come assess pt in the morning. RN will continue throughout shift and utilize proper safety equipment.    Herbert Moors, RN

## 2020-07-18 NOTE — Progress Notes (Signed)
PPD #1 No problems Afeb, VSS Fundus firm, NT at U-1 Continue routine postpartum care 

## 2020-07-18 NOTE — Anesthesia Postprocedure Evaluation (Signed)
Anesthesia Post Note  Patient: Melinda Phillips  Procedure(s) Performed: AN AD HOC LABOR EPIDURAL     Patient location during evaluation: Mother Baby Anesthesia Type: Epidural Level of consciousness: awake and alert and oriented Pain management: satisfactory to patient Vital Signs Assessment: post-procedure vital signs reviewed and stable Respiratory status: respiratory function stable Cardiovascular status: stable Postop Assessment: no headache, epidural receding, patient able to bend at knees, no signs of nausea or vomiting, adequate PO intake and able to ambulate Anesthetic complications: no Comments: The patient stated that her nurse told her that "anesthesia" should be told that she had back pain and numbness in her right leg.  I asked her if she had back pain before she had her epidural?   [name] replied that she had back pain during her pregnancy and that her back was sore in the same area this morning.  She confirmed that it had gone away while she was numb from the epidural but had returned now that the numbness had worn off.  She also confirmed that after the epidural had been placed and infusing, the right side of her body was much more numb than the left. The numbness is gone on her left side and the right side is receding continuously, with only some residual numbness in her upper thigh. She was able to ambulate to the bathroom early this morning with her husband holding her hand.  She reports that since then, her strength and ability to lift her leg and bend her knee have greatly improved.(demonstrating the motion to me in her bed).  The patient was instructed to tell her nurse to call our department if she does not continue to improve or if she has any further concerns.   No complications documented.  Last Vitals:  Vitals:   07/18/20 0220 07/18/20 0535  BP: 122/89 125/82  Pulse: 78 70  Resp: 17 17  Temp: 36.6 C 36.5 C  SpO2: 97% 100%    Last Pain:  Vitals:    07/18/20 0802  TempSrc:   PainSc: 0-No pain   Pain Goal: Patients Stated Pain Goal: 0 (07/17/20 0026)                 Karleen Dolphin

## 2020-07-19 ENCOUNTER — Other Ambulatory Visit (HOSPITAL_COMMUNITY): Payer: 59

## 2020-07-19 MED ORDER — ACETAMINOPHEN 325 MG PO TABS: 650.0000 mg | ORAL_TABLET | ORAL | 1 refills | Status: DC | PRN

## 2020-07-19 MED ORDER — IBUPROFEN 600 MG PO TABS: 600.0000 mg | ORAL_TABLET | Freq: Four times a day (QID) | ORAL | 0 refills | Status: DC

## 2020-07-19 NOTE — Progress Notes (Signed)
Post Partum Day 2 Subjective: up ad lib, tolerating PO and + flatus   Some intermittent cramping.  Bottlefeeding  Objective: Blood pressure 121/88, pulse 78, temperature 98.1 F (36.7 C), temperature source Oral, resp. rate 18, height 5\' 4"  (1.626 m), weight 85.5 kg, last menstrual period 10/18/2019, SpO2 99 %, unknown if currently breastfeeding.  Physical Exam:  General: alert and cooperative Lochia: appropriate Uterine Fundus: firm   Recent Labs    07/17/20 0137  HGB 10.3*  HCT 31.0*    Assessment/Plan: Discharge home   LOS: 2 days   09/16/20 07/19/2020, 8:41 AM

## 2020-07-19 NOTE — Discharge Summary (Signed)
Postpartum Discharge Summary       Patient Name: Melinda Phillips DOB: Sep 12, 1990 MRN: [id number]  Date of admission: 07/17/2020 Delivery date:07/17/2020  Delivering provider: Jackelyn Knife, TODD  Date of discharge: 07/19/2020  Admitting diagnosis: [redacted] weeks gestation of pregnancy [Z3A.39] Intrauterine pregnancy: [redacted]w[redacted]d     Secondary diagnosis:  Active Problems:   [redacted] weeks gestation of pregnancy  Additional problems: none    Discharge diagnosis: Term Pregnancy Delivered                                              Post partum procedures:none Augmentation: Pitocin and Cytotec Complications: None  Hospital course: Onset of Labor With Vaginal Delivery      30 y.o. yo [id number] at [redacted]w[redacted]d was admitted in Latent Labor on 07/17/2020. Patient had an uncomplicated labor course as follows:  Membrane Rupture Time/Date: 10:30 PM ,07/16/2020   Delivery Method:Vaginal, Spontaneous  Episiotomy: None  Lacerations:  2nd degree;Perineal  Patient had an uncomplicated postpartum course.  She is ambulating, tolerating a regular diet, passing flatus, and urinating well. Patient is discharged home in stable condition on 07/19/20.  Newborn Data: Birth date:07/17/2020  Birth time:3:32 PM  Gender:Female  Living status:Living  Apgars:8 ,9  Weight:3459 g   Magnesium Sulfate received: No BMZ received: No Rhophylac:No   Physical exam  Vitals:   07/18/20 0535 07/18/20 1456 07/18/20 1925 07/19/20 0532  BP: 125/82 126/69 115/83 121/88  Pulse: 70 78 86 78  Resp: 17 18 18 18   Temp: 97.7 F (36.5 C) 97.8 F (36.6 C) 97.9 F (36.6 C) 98.1 F (36.7 C)  TempSrc: Oral  Oral Oral  SpO2: 100% 99% 100% 99%  Weight:      Height:       General: alert and cooperative Lochia: appropriate Uterine Fundus: firm  Labs: Lab Results  Component Value Date   WBC 12.7 (H) 07/17/2020   HGB 10.3 (L) 07/17/2020   HCT 31.0 (L) 07/17/2020   MCV 92.0 07/17/2020   PLT 220 07/17/2020   CMP Latest Ref Rng & Units 06/27/2020   Glucose 70 - 99 mg/dL 80  BUN 6 - 20 mg/dL 5(L)  Creatinine 06/29/2020 - 1.00 mg/dL 7.67  Sodium 3.41 - 937 mmol/L 138  Potassium 3.5 - 5.1 mmol/L 3.3(L)  Chloride 98 - 111 mmol/L 106  CO2 22 - 32 mmol/L 19(L)  Calcium 8.9 - 10.3 mg/dL 902)  Total Protein 6.5 - 8.1 g/dL 6.3(L)  Total Bilirubin 0.3 - 1.2 mg/dL 4.0(X)  Alkaline Phos 38 - 126 U/L 128(H)  AST 15 - 41 U/L 24  ALT 0 - 44 U/L 22   Edinburgh Score: Edinburgh Postnatal Depression Scale Screening Tool 07/18/2020  I have been able to laugh and see the funny side of things. 0  I have looked forward with enjoyment to things. 0  I have blamed myself unnecessarily when things went wrong. 1  I have been anxious or worried for no good reason. 0  I have felt scared or panicky for no good reason. 0  Things have been getting on top of me. 0  I have been so unhappy that I have had difficulty sleeping. 0  I have felt sad or miserable. 0  I have been so unhappy that I have been crying. 0  The thought of harming myself has occurred to me. 0  09/17/2020  Postnatal Depression Scale Total 1     After visit meds:  Allergies as of 07/19/2020      Reactions   Bee Venom Anaphylaxis   Cinnamon Anaphylaxis      Medication List    STOP taking these medications   ADULT NUTRITIONAL SUPPLEMENT PO   cyclobenzaprine 10 MG tablet Commonly known as: FLEXERIL   ondansetron 4 MG disintegrating tablet Commonly known as: Zofran ODT   promethazine 25 MG tablet Commonly known as: PHENERGAN   pyridOXINE 25 MG tablet Commonly known as: VITAMIN B-6     TAKE these medications   acetaminophen 325 MG tablet Commonly known as: Tylenol Take 2 tablets (650 mg total) by mouth every 4 (four) hours as needed (for pain scale < 4).   famotidine 20 MG tablet Commonly known as: PEPCID Take 1 tablet (20 mg total) by mouth 2 (two) times daily.   ibuprofen 600 MG tablet Commonly known as: ADVIL Take 1 tablet (600 mg total) by mouth every 6 (six) hours.    loratadine 10 MG tablet Commonly known as: CLARITIN Take 10 mg by mouth daily as needed for allergies.   prenatal multivitamin Tabs tablet Take 1 tablet by mouth daily at 12 noon.        Discharge home in stable condition Infant Feeding: Bottle Infant Disposition:home with mother Discharge instruction: per After Visit Summary and Postpartum booklet. Activity: Advance as tolerated. Pelvic rest for 6 weeks.  Diet: routine diet Future Appointments:No future appointments. Follow up Visit:  Follow-up Information    Meisinger, Todd, MD. Schedule an appointment as soon as possible for a visit in 6 week(s).   Specialty: Obstetrics and Gynecology Why: postpartum Contact information: [address], [address] [place] [address] [phone]                Please schedule this patient for a In person postpartum visit in 6 weeks with the following provider: MD.   Delivery mode:  Vaginal, Spontaneous  Anticipated Birth Control:  OCPs   07/19/2020 Oliver Pila, MD

## 2020-07-21 ENCOUNTER — Inpatient Hospital Stay (HOSPITAL_COMMUNITY): Payer: 59

## 2020-07-21 ENCOUNTER — Inpatient Hospital Stay (HOSPITAL_COMMUNITY): Admission: AD | Admit: 2020-07-21 | Payer: 59 | Source: Home / Self Care | Admitting: Obstetrics and Gynecology

## 2020-07-24 ENCOUNTER — Encounter (HOSPITAL_COMMUNITY): Payer: 59

## 2021-03-11 ENCOUNTER — Encounter (HOSPITAL_COMMUNITY): Payer: Self-pay

## 2021-03-11 ENCOUNTER — Other Ambulatory Visit: Payer: Self-pay

## 2021-03-11 ENCOUNTER — Emergency Department (HOSPITAL_COMMUNITY)
Admission: EM | Admit: 2021-03-11 | Discharge: 2021-03-11 | Disposition: A | Discharge status: Home / Self Care | Payer: 59 | Attending: Emergency Medicine | Admitting: Emergency Medicine

## 2021-03-11 ENCOUNTER — Emergency Department (HOSPITAL_COMMUNITY): Payer: 59

## 2021-03-11 DIAGNOSIS — S93402A Sprain of unspecified ligament of left ankle, initial encounter: Secondary | ICD-10-CM | POA: Diagnosis not present

## 2021-03-11 DIAGNOSIS — Y9302 Activity, running: Secondary | ICD-10-CM | POA: Insufficient documentation

## 2021-03-11 DIAGNOSIS — X501XXA Overexertion from prolonged static or awkward postures, initial encounter: Secondary | ICD-10-CM | POA: Diagnosis not present

## 2021-03-11 DIAGNOSIS — S99912A Unspecified injury of left ankle, initial encounter: Secondary | ICD-10-CM | POA: Diagnosis present

## 2021-03-11 DIAGNOSIS — Z87891 Personal history of nicotine dependence: Secondary | ICD-10-CM | POA: Insufficient documentation

## 2021-03-11 DIAGNOSIS — M25572 Pain in left ankle and joints of left foot: Secondary | ICD-10-CM | POA: Insufficient documentation

## 2021-03-11 NOTE — ED Provider Notes (Signed)
[facility] EMERGENCY DEPARTMENT Provider Note   CSN: [id number] Arrival date & time: 03/11/21  1043     History Chief Complaint  Patient presents with   Ankle Pain    Melinda Phillips is a 30 y.o. female with no significant past medical history who presents to the ED complaining of left ankle injury onset yesterday.  She notes she was chasing a chicken yesterday when she stepped in a hole and then heard a pop.  She has associated swelling to the left ankle.  Patient has tried ice, Ace wrap, Tylenol with some relief of her symptoms.  Patient denies knee pain, lower leg pain, wound, color change, fever, chills.   Ankle Pain Location:  Ankle and foot Time since incident:  1 day Injury: yes   Mechanism of injury comment:  Twisting ankle in hole Ankle location:  L ankle Foot location:  L foot Pain details:    Radiates to:  Does not radiate   Severity:  Mild   Onset quality:  Sudden   Duration:  1 day   Timing:  Constant   Progression:  Unchanged Chronicity:  New Dislocation: no   Foreign body present:  No foreign bodies Prior injury to area:  No Relieved by:  Acetaminophen, elevation, ice and compression Worsened by:  Activity Ineffective treatments:  None tried Associated symptoms: decreased ROM and swelling   Associated symptoms: no fever, no numbness, no stiffness and no tingling       Past Medical History:  Diagnosis Date   History of kidney stones    Mononucleosis    Strep throat 05/2015   Umbilical hernia     Patient Active Problem List   Diagnosis Date Noted   [redacted] weeks gestation of pregnancy 07/17/2020   Fall 05/25/2020   Normal vaginal delivery 09/28/2015   Active labor 09/27/2015    Past Surgical History:  Procedure Laterality Date   NO PAST SURGERIES     UMBILICAL HERNIA REPAIR N/A 07/02/2018   Procedure: LAPAROSCOPIC ASSISTED REPAIR OF UMBILICAL AND SUPRAUMBILICAL HERNIA WITH MESH;  Surgeon: Gaynelle Adu, MD;  Location: [facility];   Service: General;  Laterality: N/A;     OB History     Gravida  2   Para  2   Term  2   Preterm      AB      Living  2      SAB      IAB      Ectopic      Multiple  0   Live Births  2           Family History  Problem Relation Age of Onset   Hypertension Mother    Hypertension Father    Heart disease Father    Heart attack Father    Hyperlipidemia Father    Diabetes Father    Hypertension Maternal Grandmother    Diabetes Maternal Grandmother    Stroke Maternal Grandmother    Heart attack Maternal Grandmother    Hypertension Maternal Grandfather    Hypertension Paternal Grandmother    Hypertension Paternal Grandfather    Heart disease Paternal Grandfather    Heart attack Paternal Grandfather    Diabetes Paternal Grandfather     Social History   Tobacco Use   Smoking status: Former    Types: Cigarettes    Quit date: 09/26/2013    Years since quitting: 7.4   Smokeless tobacco: Never  Vaping Use   Vaping  Use: Never used  Substance Use Topics   Alcohol use: No   Drug use: No    Home Medications Prior to Admission medications   Medication Sig Start Date End Date Taking? Authorizing Provider  acetaminophen (TYLENOL) 325 MG tablet Take 2 tablets (650 mg total) by mouth every 4 (four) hours as needed (for pain scale < 4). 07/19/20   Huel Cote, MD  famotidine (PEPCID) 20 MG tablet Take 1 tablet (20 mg total) by mouth 2 (two) times daily. 12/06/19   Rolm Bookbinder, CNM  ibuprofen (ADVIL) 600 MG tablet Take 1 tablet (600 mg total) by mouth every 6 (six) hours. 07/19/20   Huel Cote, MD  loratadine (CLARITIN) 10 MG tablet Take 10 mg by mouth daily as needed for allergies.    [provider]  Prenatal Vit-Fe Fumarate-FA (PRENATAL MULTIVITAMIN) TABS tablet Take 1 tablet by mouth daily at 12 noon.    [provider]    Allergies    Bee venom and Cinnamon  Review of Systems   Review of Systems  Constitutional:  Negative  for chills and fever.  Musculoskeletal:  Positive for arthralgias and joint swelling. Negative for stiffness.  Skin:  Negative for color change and wound.  All other systems reviewed and are negative.  Physical Exam Updated Vital Signs BP 123/85 (BP Location: Left Arm)   Pulse 83   Temp 98.7 F (37.1 C) (Oral)   Resp 17   SpO2 100%   Physical Exam Vitals and nursing note reviewed.  Constitutional:      General: She is not in acute distress.    Appearance: Normal appearance.  Eyes:     General: No scleral icterus.    Extraocular Movements: Extraocular movements intact.  Cardiovascular:     Rate and Rhythm: Normal rate.  Pulmonary:     Effort: Pulmonary effort is normal. No respiratory distress.  Musculoskeletal:     Cervical back: Neck supple.     Right knee: Normal.     Left knee: Normal.     Right ankle: Normal.     Right Achilles Tendon: Normal.     Left ankle: Swelling present. No deformity, ecchymosis or lacerations. Tenderness present. Decreased range of motion.     Left Achilles Tendon: Normal.     Right foot: Normal.     Left foot: Normal range of motion and normal capillary refill. Bony tenderness present. No swelling, deformity, laceration or tenderness. Normal pulse.     Comments: Tenderness to palpation to base of left fifth MTP joint and minimal tenderness to palpation to left lateral malleolus.  Mild swelling to left lateral malleolus. No obvious deformity, effusion, erythema, or swelling. Full active range of motion of left ankle. Patient able to ambulate without difficulty or assistance. No antalgic gait.  Strength and sensation intact to bilateral lower extremities.   Skin:    General: Skin is warm and dry.     Findings: No bruising, erythema or rash.  Neurological:     Mental Status: She is alert.  Psychiatric:        Behavior: Behavior normal.    ED Results / Procedures / Treatments   Labs (all labs ordered are listed, but only abnormal results are  displayed) Labs Reviewed - No data to display  EKG None  Radiology DG Ankle Complete Left  Addendum Date: 03/11/2021   ADDENDUM REPORT: 03/11/2021 12:22 ADDENDUM: The impression should read that there is NO acute fracture or dislocation in the ankle or  foot. Electronically Signed   By: Lesia Hausen M.D.   On: 03/11/2021 12:22   Result Date: 03/11/2021 CLINICAL DATA:  Twisted left foot and ankle in a hole while running, pain in anterior ankle and midfoot EXAM: LEFT ANKLE COMPLETE - 3+ VIEW; LEFT FOOT - COMPLETE 3+ VIEW COMPARISON:  None. FINDINGS: Ankle: There is no acute fracture or dislocation. Alignment is normal. The ankle mortise is intact. The soft tissues are unremarkable. Foot: There is no acute fracture or dislocation. Bony alignment is normal. The joint spaces are preserved. The Lisfranc and Chopart joints are intact. The soft tissues are unremarkable. IMPRESSION: Acute fracture or dislocation of the ankle or foot. Electronically Signed: By: Lesia Hausen M.D. On: 03/11/2021 12:06   DG Foot Complete Left  Addendum Date: 03/11/2021   ADDENDUM REPORT: 03/11/2021 12:22 ADDENDUM: The impression should read that there is NO acute fracture or dislocation in the ankle or foot. Electronically Signed   By: Lesia Hausen M.D.   On: 03/11/2021 12:22   Result Date: 03/11/2021 CLINICAL DATA:  Twisted left foot and ankle in a hole while running, pain in anterior ankle and midfoot EXAM: LEFT ANKLE COMPLETE - 3+ VIEW; LEFT FOOT - COMPLETE 3+ VIEW COMPARISON:  None. FINDINGS: Ankle: There is no acute fracture or dislocation. Alignment is normal. The ankle mortise is intact. The soft tissues are unremarkable. Foot: There is no acute fracture or dislocation. Bony alignment is normal. The joint spaces are preserved. The Lisfranc and Chopart joints are intact. The soft tissues are unremarkable. IMPRESSION: Acute fracture or dislocation of the ankle or foot. Electronically Signed: By: Lesia Hausen M.D. On:  03/11/2021 12:06    Procedures .Splint Application  Date/Time: 03/11/2021 1:00 PM Performed by: Chestine Spore A, PA-C Authorized by: Karenann Cai, PA-C   Consent:    Consent obtained:  Verbal   Consent given by:  Patient   Risks, benefits, and alternatives were discussed: yes     Risks discussed:  Pain and swelling   Alternatives discussed:  No treatment Universal protocol:    Procedure explained and questions answered to patient or proxy's satisfaction: yes     Relevant documents present and verified: yes     Imaging studies available: yes     Site/side marked: yes     Immediately prior to procedure a time out was called: yes     Patient identity confirmed:  Verbally with patient and arm band Pre-procedure details:    Distal neurologic exam:  Normal   Distal perfusion: distal pulses strong and brisk capillary refill   Procedure details:    Location:  Ankle   Ankle location:  L ankle   Lower extremity splint type: air cast and post op boot. Post-procedure details:    Distal neurologic exam:  Normal   Distal perfusion: distal pulses strong and brisk capillary refill     Procedure completion:  Tolerated well, no immediate complications   Medications Ordered in ED Medications - No data to display  ED Course  I have reviewed the triage vital signs and the nursing notes.  Pertinent labs & imaging results that were available during my care of the patient were reviewed by me and considered in my medical decision making (see chart for details).  Clinical Course as of 03/11/21 1323  Fri Mar 11, 2021  1212 Discussed with Radiologist, Dr. Theresia Bough regarding readings,  [SB]  1214 Patient reevaluated prior to discharge.  Updated patient with imaging findings and discharge treatment  plan.  Patient agreeable to the discharge treatment plan this time.  Patient appears safe for discharge. [SB]  1229 Reevaluated after application of Aircast, postop boot.  Patient ambulating in room.   Discussed discharge treatment plan.  Patient appears safe for discharge this time. [SB]    Clinical Course User Index [SB] Novice Vrba A, PA-C   MDM Rules/Calculators/A&P                         Patient with left ankle injury onset yesterday.  Patient stepped in a hole twisted her ankle and heard a pop.  On exam patient with tenderness to palpation to the base of the fifth MTP, no overlying ecchymosis, erythema.  DP and PT pulses intact bilaterally.  Full active range of motion.  Differential diagnosis includes sprain, fracture, dislocation.  Left ankle and foot x-ray negative for acute fractures or dislocations.  Patient afebrile with vital signs within normal limits.  Less likely fracture or dislocation.  This is likely acute sprain of left ankle.    Postop shoe, Aircast, crutches provided to patient today.  Strict return precautions provided to patient regarding fever, increasing/worsening swelling, color change, or gait issue.  Supportive care and return precautions discussed with patient.  Patient acknowledges and verbalizes understanding.  Patient for safe discharge at this time.  Follow-up as indicated in discharge provided.  Final Clinical Impression(s) / ED Diagnoses Final diagnoses:  Sprain of left ankle, unspecified ligament, initial encounter    Rx / DC Orders ED Discharge Orders     None        Majorie Santee A, PA-C 03/11/21 1323    Sloan Leiter, DO 03/12/21 1621

## 2021-03-11 NOTE — Discharge Instructions (Addendum)
Your x-rays were negative for fracture or dislocation.  This is an ankle sprain, sprains can take up to 2 weeks to heal.  You may continue with RICE therapy.  You may take over the counter 600 mg ibuprofen every 6 hours or 1000 mg Tylenol every 6 hours as needed for pain.  You may apply ice to the affected area for up to 15 minutes at a time.  You may follow-up with your primary care provider as needed. Return to the emergency department if you are experiencing increasing/worsening bruising, swelling, pain.

## 2021-03-11 NOTE — ED Triage Notes (Signed)
Pt reports she was chasing a chicken yesterday and stepped in a whole, heard a "pop". Iced the area and used tylenol with some relief.

## 2021-08-01 ENCOUNTER — Encounter (HOSPITAL_COMMUNITY): Payer: Self-pay

## 2021-08-01 ENCOUNTER — Ambulatory Visit (HOSPITAL_COMMUNITY)
Admission: EM | Admit: 2021-08-01 | Discharge: 2021-08-01 | Disposition: A | Discharge status: Home / Self Care | Payer: 59 | Attending: Nurse Practitioner | Admitting: Nurse Practitioner

## 2021-08-01 DIAGNOSIS — A084 Viral intestinal infection, unspecified: Secondary | ICD-10-CM

## 2021-08-01 DIAGNOSIS — R109 Unspecified abdominal pain: Secondary | ICD-10-CM

## 2021-08-01 LAB — POC URINE PREG, ED: Preg Test, Ur: NEGATIVE

## 2021-08-01 MED ORDER — ONDANSETRON 4 MG PO TBDP: 4.0000 mg | ORAL_TABLET | Freq: Three times a day (TID) | ORAL | 0 refills | Status: DC | PRN

## 2021-08-01 MED ORDER — PROMETHAZINE HCL 12.5 MG PO TABS: 12.5000 mg | ORAL_TABLET | Freq: Four times a day (QID) | ORAL | 0 refills | Status: DC | PRN

## 2021-08-01 NOTE — Discharge Instructions (Addendum)
-   You can use the Zofran and Phenergan alternating to help with the nausea/vomiting.  Do not take them at the same time ?- Please try to drink Pedialyte or some type of drink with electrolytes ?-Your symptoms should improved over the next 1-2 days; please seek care if you are not beginning to feel better ?

## 2021-08-01 NOTE — ED Triage Notes (Signed)
Pt presents with c/o N/V/D and back pain x 3 days.  ? ?Pt states she has been exposed to the Noro Virus.  ?

## 2021-08-01 NOTE — ED Provider Notes (Signed)
?[facility] ? ? ? ?CSN: [id number] ?Arrival date & time: 08/01/21  0831 ? ? ?  ? ?History   ?Chief Complaint ?Chief Complaint  ?Patient presents with  ? Nausea  ? Vomiting  ? Diarrhea  ? ? ?HPI ?[name] is a 31 y.o. female.  ? ?Patient presents with nausea, bilious vomiting, diarrhea since 3 AM this morning.  She reports her daughter was recently diagnosed with norovirus.  Husband started having symptoms yesterday as well.  She denies fevers, but does report chills and body aches.  She denies any shortness of breath or chest pain.  She has been unable to eat or drink anything since her symptoms started.  She has had multiple episodes of vomiting and diarrhea, however denies any blood in the vomit or stool.  She describes her stool as "watery".  Reports lower abdominal cramping prior to vomiting or diarrhea.  No blood in her urine.  She has tried expired Zofran which has not been helping with her symptoms. ? ? ?Past Medical History:  ?Diagnosis Date  ? History of kidney stones   ? Mononucleosis   ? Strep throat 05/2015  ? Umbilical hernia   ? ? ?Patient Active Problem List  ? Diagnosis Date Noted  ? [redacted] weeks gestation of pregnancy 07/17/2020  ? Fall 05/25/2020  ? Normal vaginal delivery 09/28/2015  ? Active labor 09/27/2015  ? ? ?Past Surgical History:  ?Procedure Laterality Date  ? NO PAST SURGERIES    ? UMBILICAL HERNIA REPAIR N/A 07/02/2018  ? Procedure: LAPAROSCOPIC ASSISTED REPAIR OF UMBILICAL AND SUPRAUMBILICAL HERNIA WITH MESH;  Surgeon: Gaynelle AduWilson, Eric, MD;  Location: [facility];  Service: General;  Laterality: N/A;  ? ? ?OB History   ? ? Gravida  ?2  ? Para  ?2  ? Term  ?2  ? Preterm  ?   ? AB  ?   ? Living  ?2  ?  ? ? SAB  ?   ? IAB  ?   ? Ectopic  ?   ? Multiple  ?0  ? Live Births  ?2  ?   ?  ?  ? ? ? ?Home Medications   ? ?Prior to Admission medications   ?Medication Sig Start Date End Date Taking? Authorizing Provider  ?ondansetron (ZOFRAN-ODT) 4 MG disintegrating tablet Take 1 tablet (4 mg  total) by mouth every 8 (eight) hours as needed for nausea or vomiting. 08/01/21  Yes Valentino NoseMartinez, Loye Reininger A, NP  ?promethazine (PHENERGAN) 12.5 MG tablet Take 1 tablet (12.5 mg total) by mouth every 6 (six) hours as needed for nausea or vomiting. Take if nausea/vomiting unrelieved with Zofran 08/01/21  Yes Valentino NoseMartinez, Teisha Trowbridge A, NP  ?acetaminophen (TYLENOL) 325 MG tablet Take 2 tablets (650 mg total) by mouth every 4 (four) hours as needed (for pain scale < 4). 07/19/20   Huel Coteichardson, Kathy, MD  ?famotidine (PEPCID) 20 MG tablet Take 1 tablet (20 mg total) by mouth 2 (two) times daily. 12/06/19   Rolm BookbinderNeill, Caroline M, CNM  ?ibuprofen (ADVIL) 600 MG tablet Take 1 tablet (600 mg total) by mouth every 6 (six) hours. 07/19/20   Huel Coteichardson, Kathy, MD  ?loratadine (CLARITIN) 10 MG tablet Take 10 mg by mouth daily as needed for allergies.    [provider]  ?Prenatal Vit-Fe Fumarate-FA (PRENATAL MULTIVITAMIN) TABS tablet Take 1 tablet by mouth daily at 12 noon.    [provider]  ? ? ?Family History ?Family History  ?Problem Relation Age of  Onset  ? Hypertension Mother   ? Hypertension Father   ? Heart disease Father   ? Heart attack Father   ? Hyperlipidemia Father   ? Diabetes Father   ? Hypertension Maternal Grandmother   ? Diabetes Maternal Grandmother   ? Stroke Maternal Grandmother   ? Heart attack Maternal Grandmother   ? Hypertension Maternal Grandfather   ? Hypertension Paternal Grandmother   ? Hypertension Paternal Grandfather   ? Heart disease Paternal Grandfather   ? Heart attack Paternal Grandfather   ? Diabetes Paternal Grandfather   ? ? ?Social History ?Social History  ? ?Tobacco Use  ? Smoking status: Former  ?  Types: Cigarettes  ?  Quit date: 09/26/2013  ?  Years since quitting: 7.8  ? Smokeless tobacco: Never  ?Vaping Use  ? Vaping Use: Never used  ?Substance Use Topics  ? Alcohol use: No  ? Drug use: No  ? ? ? ?Allergies   ?Bee venom and Cinnamon ? ? ?Review of Systems ?Review of Systems ?Per  HPI ? ?Physical Exam ?Triage Vital Signs ?ED Triage Vitals  ?Enc Vitals Group  ?   BP 08/01/21 0910 112/70  ?   Pulse Rate 08/01/21 0910 89  ?   Resp 08/01/21 0910 17  ?   Temp 08/01/21 0910 98 ?F (36.7 ?C)  ?   Temp Source 08/01/21 0910 Oral  ?   SpO2 08/01/21 0910 98 %  ?   Weight --   ?   Height --   ?   Head Circumference --   ?   Peak Flow --   ?   Pain Score 08/01/21 0909 6  ?   Pain Loc --   ?   Pain Edu? --   ?   Excl. in GC? --   ? ?No data found. ? ?Updated Vital Signs ?BP 112/70 (BP Location: Left Arm)   Pulse 89   Temp 98 ?F (36.7 ?C) (Oral)   Resp 17   LMP 07/19/2021 (Exact Date)   SpO2 98%  ? ?Visual Acuity ?Right Eye Distance:   ?Left Eye Distance:   ?Bilateral Distance:   ? ?Right Eye Near:   ?Left Eye Near:    ?Bilateral Near:    ? ?Physical Exam ?Vitals and nursing note reviewed.  ?Constitutional:   ?   General: She is not in acute distress. ?   Appearance: Normal appearance. She is obese. She is not ill-appearing, toxic-appearing or diaphoretic.  ?HENT:  ?   Mouth/Throat:  ?   Mouth: Mucous membranes are moist.  ?   Pharynx: Oropharynx is clear.  ?Eyes:  ?   General: No scleral icterus. ?   Extraocular Movements: Extraocular movements intact.  ?Abdominal:  ?   General: Abdomen is flat. Bowel sounds are increased.  ?   Palpations: Abdomen is soft.  ?   Tenderness: There is no abdominal tenderness. There is no right CVA tenderness or left CVA tenderness.  ?Skin: ?   General: Skin is warm and dry.  ?   Capillary Refill: Capillary refill takes less than 2 seconds.  ?   Coloration: Skin is not jaundiced or pale.  ?   Findings: No erythema.  ?Neurological:  ?   Mental Status: She is alert and oriented to person, place, and time.  ?   Motor: No weakness.  ?   Gait: Gait normal.  ?Psychiatric:     ?   Mood and Affect: Mood normal.     ?  Behavior: Behavior normal. Behavior is cooperative.  ? ? ? ?UC Treatments / Results  ?Labs ?(all labs ordered are listed, but only abnormal results are  displayed) ?Labs Reviewed  ?POC URINE PREG, ED  ? ? ?EKG ? ? ?Radiology ?No results found. ? ?Procedures ?Procedures (including critical care time) ? ?Medications Ordered in UC ?Medications - No data to display ? ?Initial Impression / Assessment and Plan / UC Course  ?I have reviewed the triage vital signs and the nursing notes. ? ?Pertinent labs & imaging results that were available during my care of the patient were reviewed by me and considered in my medical decision making (see chart for details). ? ?  ?Suspect viral gastroenteritis-likely norovirus.  Treat nausea/vomiting with Zofran and Phenergan alternating.  Urine pregnancy test today negative.  Encouraged hydration with Pedialyte or other sugar-free electrolyte drink as tolerated.  Seek care if symptoms persist or worsen for more than 3 days.  Note given for work. ?Final Clinical Impressions(s) / UC Diagnoses  ? ?Final diagnoses:  ?Viral gastroenteritis  ?Abdominal pain, vomiting, and diarrhea  ? ? ? ?Discharge Instructions   ? ?  ?- You can use the Zofran and Phenergan alternating to help with the nausea/vomiting.  Do not take them at the same time ?- Please try to drink Pedialyte or some type of drink with electrolytes ?-Your symptoms should improved over the next 1-2 days; please seek care if you are not beginning to feel better ? ? ? ?ED Prescriptions   ? ? Medication Sig Dispense Auth. Provider  ? ondansetron (ZOFRAN-ODT) 4 MG disintegrating tablet Take 1 tablet (4 mg total) by mouth every 8 (eight) hours as needed for nausea or vomiting. 20 tablet Cathlean Marseilles A, NP  ? promethazine (PHENERGAN) 12.5 MG tablet Take 1 tablet (12.5 mg total) by mouth every 6 (six) hours as needed for nausea or vomiting. Take if nausea/vomiting unrelieved with Zofran 30 tablet Valentino Nose, NP  ? ?  ? ?PDMP not reviewed this encounter. ?  ?Valentino Nose, NP ?08/01/21 0945 ? ?

## 2022-09-04 ENCOUNTER — Encounter (HOSPITAL_COMMUNITY): Payer: Self-pay

## 2022-09-04 ENCOUNTER — Emergency Department (HOSPITAL_COMMUNITY): Payer: 59

## 2022-09-04 ENCOUNTER — Other Ambulatory Visit: Payer: Self-pay

## 2022-09-04 ENCOUNTER — Emergency Department (HOSPITAL_COMMUNITY)
Admission: EM | Admit: 2022-09-04 | Discharge: 2022-09-04 | Disposition: A | Discharge status: Home / Self Care | Payer: 59 | Attending: Emergency Medicine | Admitting: Emergency Medicine

## 2022-09-04 DIAGNOSIS — R109 Unspecified abdominal pain: Secondary | ICD-10-CM | POA: Insufficient documentation

## 2022-09-04 LAB — CBC
HCT: 40.6 % (ref 36.0–46.0)
Hemoglobin: 14.6 g/dL (ref 12.0–15.0)
MCH: 34.6 pg — ABNORMAL HIGH (ref 26.0–34.0)
MCHC: 36 g/dL (ref 30.0–36.0)
MCV: 96.2 fL (ref 80.0–100.0)
Platelets: 242 10*3/uL (ref 150–400)
RBC: 4.22 MIL/uL (ref 3.87–5.11)
RDW: 12.9 % (ref 11.5–15.5)
WBC: 8.4 10*3/uL (ref 4.0–10.5)
nRBC: 0 % (ref 0.0–0.2)

## 2022-09-04 LAB — HEPATIC FUNCTION PANEL
ALT: 22 U/L (ref 0–44)
AST: 21 U/L (ref 15–41)
Albumin: 3.9 g/dL (ref 3.5–5.0)
Alkaline Phosphatase: 59 U/L (ref 38–126)
Bilirubin, Direct: 0.1 mg/dL (ref 0.0–0.2)
Indirect Bilirubin: 1 mg/dL — ABNORMAL HIGH (ref 0.3–0.9)
Total Bilirubin: 1.1 mg/dL (ref 0.3–1.2)
Total Protein: 6.8 g/dL (ref 6.5–8.1)

## 2022-09-04 LAB — URINALYSIS, ROUTINE W REFLEX MICROSCOPIC
Bilirubin Urine: NEGATIVE
Glucose, UA: NEGATIVE mg/dL
Ketones, ur: NEGATIVE mg/dL
Nitrite: NEGATIVE
Protein, ur: NEGATIVE mg/dL
Specific Gravity, Urine: 1.017 (ref 1.005–1.030)
pH: 6 (ref 5.0–8.0)

## 2022-09-04 LAB — I-STAT BETA HCG BLOOD, ED (MC, WL, AP ONLY): I-stat hCG, quantitative: <5 m[IU]/mL (ref <5)

## 2022-09-04 LAB — BASIC METABOLIC PANEL
Anion gap: 9 (ref 5–15)
BUN: 10 mg/dL (ref 6–20)
CO2: 21 mmol/L — ABNORMAL LOW (ref 22–32)
Calcium: 8.8 mg/dL — ABNORMAL LOW (ref 8.9–10.3)
Chloride: 107 mmol/L (ref 98–111)
Creatinine, Ser: 0.82 mg/dL (ref 0.44–1.00)
GFR, Estimated: >60 mL/min (ref >60)
Glucose, Bld: 94 mg/dL (ref 70–99)
Potassium: 4 mmol/L (ref 3.5–5.1)
Sodium: 137 mmol/L (ref 135–145)

## 2022-09-04 MED ORDER — TIZANIDINE HCL 4 MG PO TABS: 4.0000 mg | ORAL_TABLET | Freq: Three times a day (TID) | ORAL | 0 refills | Status: AC

## 2022-09-04 NOTE — ED Provider Notes (Incomplete)
[place] EMERGENCY DEPARTMENT AT [facility] Provider Note   CSN: [id number] Arrival date & time: 09/04/22  7829     History  Chief Complaint  Patient presents with   Flank Pain    Melinda Phillips is a 32 y.o. female with no significant past medical history.  Patient reports burning right lower back pain and flank pain. Says she has dull Left lower back pain also that started today. There has been no trauma, changes in activity or other identifiable etiology for this and she has never had it before. Tried tylenol with no relief.Denies any LE weakness or sensation loss, denies bowel or bladder incontinence, denies any saddle anesthesia. Additionally denies hematuria or dysuria but has had increasing urinary frequency.Says since Friday has had diarrhea, no black or grossly blood stools and not improving or worsening. Denies any abnormal foods or other sick contacts. Had one episode of vomiting with bright red blood streaked in it this AM but still tolerating food well. Denies alcohol use and rarely uses NSAIDs. Has had kidney stones in the past incidentally, says she has not ever had symptoms of these that she knows of. Also endorses left parasternal pleuritic chest pain. Denies cough or dyspnea, no other chest pain. Not on OCPs.   Flank Pain       Home Medications Prior to Admission medications   Medication Sig Start Date End Date Taking? Authorizing Provider  acetaminophen (TYLENOL) 325 MG tablet Take 2 tablets (650 mg total) by mouth every 4 (four) hours as needed (for pain scale < 4). 07/19/20   Huel Cote, MD  famotidine (PEPCID) 20 MG tablet Take 1 tablet (20 mg total) by mouth 2 (two) times daily. 12/06/19   Rolm Bookbinder, CNM  ibuprofen (ADVIL) 600 MG tablet Take 1 tablet (600 mg total) by mouth every 6 (six) hours. 07/19/20   Huel Cote, MD  loratadine (CLARITIN) 10 MG tablet Take 10 mg by mouth daily as needed for allergies.    [provider]  ondansetron (ZOFRAN-ODT) 4 MG disintegrating tablet Take 1 tablet (4 mg total) by mouth every 8 (eight) hours as needed for nausea or vomiting. 08/01/21   Valentino Nose, NP  Prenatal Vit-Fe Fumarate-FA (PRENATAL MULTIVITAMIN) TABS tablet Take 1 tablet by mouth daily at 12 noon.    [provider]  promethazine (PHENERGAN) 12.5 MG tablet Take 1 tablet (12.5 mg total) by mouth every 6 (six) hours as needed for nausea or vomiting. Take if nausea/vomiting unrelieved with Zofran 08/01/21   Valentino Nose, NP      Allergies    Bee venom and Cinnamon    Review of Systems   Review of Systems  Genitourinary:  Positive for flank pain.  All other systems reviewed and are negative.   Physical Exam Updated Vital Signs BP 126/72 (BP Location: Right Arm)   Pulse 76   Temp 98.1 F (36.7 C)   Resp 16   Ht 5\' 4"  (1.626 m)   Wt 85.3 kg   SpO2 100%   BMI 32.27 kg/m  Physical Exam Constitutional:      General: She is in acute distress.     Appearance: Normal appearance. She is obese. She is not ill-appearing, toxic-appearing or diaphoretic.  Eyes:     Extraocular Movements: Extraocular movements intact.     Conjunctiva/sclera: Conjunctivae normal.     Pupils: Pupils are equal, round, and reactive to light.  Cardiovascular:     Rate and Rhythm:  Normal rate and regular rhythm.     Pulses: Normal pulses.     Heart sounds: Normal heart sounds. No murmur heard.    No gallop.  Pulmonary:     Effort: Pulmonary effort is normal. No respiratory distress.     Breath sounds: Normal breath sounds. No wheezing or rales.  Abdominal:     General: Abdomen is flat. Bowel sounds are normal.     Palpations: Abdomen is soft. There is no mass.     Tenderness: There is right CVA tenderness and left CVA tenderness. There is no guarding or rebound.     Comments: PTP at the RUQ and LUQ  Musculoskeletal:     Right lower leg: No edema.     Left lower leg: No edema.     Comments: No bony  spine ptp, R>L lumbar paraspinal ptp, 5/5 bilateral LE strength with no loss of sensation to gross touch  Skin:    General: Skin is warm and dry.     Coloration: Skin is not jaundiced.  Neurological:     General: No focal deficit present.     Mental Status: She is alert.     Sensory: No sensory deficit.     Motor: No weakness.     Gait: Gait normal.     ED Results / Procedures / Treatments   Labs (all labs ordered are listed, but only abnormal results are displayed) Labs Reviewed  URINALYSIS, ROUTINE W REFLEX MICROSCOPIC - Abnormal; Notable for the following components:      Result Value   APPearance HAZY (*)    Hgb urine dipstick MODERATE (*)    Leukocytes,Ua MODERATE (*)    Bacteria, UA FEW (*)    All other components within normal limits  CBC - Abnormal; Notable for the following components:   MCH 34.6 (*)    All other components within normal limits  BASIC METABOLIC PANEL - Abnormal; Notable for the following components:   CO2 21 (*)    Calcium 8.8 (*)    All other components within normal limits  I-STAT BETA HCG BLOOD, ED ([facility], [facility], [facility] ONLY)    EKG None  Radiology No results found.  Procedures Procedures    Medications Ordered in ED Medications - No data to display  ED Course/ Medical Decision Making/ A&P   {   Click here for ABCD2, HEART and other calculatorsREFRESH Note before signing :1}                          Medical Decision Making Amount and/or Complexity of Data Reviewed Labs: ordered. Radiology: ordered.   Patient presents afebrile, HDS with burning left lower back and flank pain in the setting of hx of renal stones and 4 days of diarrhea and 1 episode of hematemesis. Exam consistent with CVA tenderness as well as paraspinal ptp. Differential includes MSK lower back pain, viral gastroenteritis, Complicated UTI/pyelo, renal stones, biliary disease. No c/f cord compression given no saddle anesthesia, loss of bowel or bladder function, LE weakness  or sensory changes. Less likely pyelonephritis given no fever,elevated wbc count, or pyuria. Has a history of renal stones and UA with microscopic hematuria getting ct renal protocol to evaluate. Given RUQ ptp with recent exacerbation of this with eating will get hepatic function panel and see what the CT shows. Not pregnant per istat beta hcg. Patient does not wish to have pain meds at this time.Marland Kitchen   Update: CT A/P  without any evidence of infection or inflammation, no evidence of renal stones, no evidence of any MSK abnormality. BMP wnl. CBC wnl. Given flank and low back pain without infectious etiology or red flag symptoms and reproducible paraspinal muscle pain to palpation do no think any other work up is warranted. Can trial tizanadine for muscle spasms at discharge. Patient should follow up with pcp provider.   Final Clinical Impression(s) / ED Diagnoses Final diagnoses:  None    Rx / DC Orders ED Discharge Orders     None

## 2022-09-04 NOTE — ED Triage Notes (Signed)
Patient complains of lower back pain for the past few days and worse with any change in position. Denies trauma. Pain seems to be worse on right flank area.

## 2022-09-04 NOTE — ED Notes (Signed)
Patient transported to CT 

## 2022-09-04 NOTE — Discharge Instructions (Addendum)
You were in the emergency department for back and flank pain. Your CT scan did not show anything abnormal in the abdomen or with the spine. Your blood work all looked good. There is nothing dangerous that needs further evaluation at this time. We can try tizanidine a muscle relaxer for msucle spasms. Take the first at night to see if it makes you sleepy. If so I would caution you with taking this while driving or at work. You should follow up with a primary care doctor. You can call [phone] to schedule a pcp visit in the internal medicine center at [facility]. It was a pleasure caring for you!

## 2023-01-19 ENCOUNTER — Ambulatory Visit (HOSPITAL_COMMUNITY)
Admission: EM | Admit: 2023-01-19 | Discharge: 2023-01-19 | Disposition: A | Discharge status: Home / Self Care | Payer: 59 | Attending: Physician Assistant | Admitting: Physician Assistant

## 2023-01-19 ENCOUNTER — Encounter (HOSPITAL_COMMUNITY): Payer: Self-pay

## 2023-01-19 DIAGNOSIS — Z87891 Personal history of nicotine dependence: Secondary | ICD-10-CM | POA: Diagnosis not present

## 2023-01-19 DIAGNOSIS — R112 Nausea with vomiting, unspecified: Secondary | ICD-10-CM

## 2023-01-19 DIAGNOSIS — K920 Hematemesis: Secondary | ICD-10-CM | POA: Diagnosis not present

## 2023-01-19 DIAGNOSIS — J029 Acute pharyngitis, unspecified: Secondary | ICD-10-CM | POA: Diagnosis present

## 2023-01-19 DIAGNOSIS — J069 Acute upper respiratory infection, unspecified: Secondary | ICD-10-CM

## 2023-01-19 DIAGNOSIS — Z8616 Personal history of COVID-19: Secondary | ICD-10-CM | POA: Insufficient documentation

## 2023-01-19 DIAGNOSIS — U071 COVID-19: Secondary | ICD-10-CM | POA: Diagnosis not present

## 2023-01-19 LAB — CBC
HCT: 38.5 % (ref 36.0–46.0)
Hemoglobin: 13.6 g/dL (ref 12.0–15.0)
MCH: 33.1 pg (ref 26.0–34.0)
MCHC: 35.3 g/dL (ref 30.0–36.0)
MCV: 93.7 fL (ref 80.0–100.0)
Platelets: 222 10*3/uL (ref 150–400)
RBC: 4.11 MIL/uL (ref 3.87–5.11)
RDW: 12.5 % (ref 11.5–15.5)
WBC: 6.1 10*3/uL (ref 4.0–10.5)
nRBC: 0 % (ref 0.0–0.2)

## 2023-01-19 LAB — POCT INFLUENZA A/B
Influenza A, POC: NEGATIVE
Influenza B, POC: NEGATIVE

## 2023-01-19 MED ORDER — ONDANSETRON 4 MG PO TBDP: 4.0000 mg | ORAL_TABLET | Freq: Three times a day (TID) | ORAL | 0 refills | Status: DC | PRN

## 2023-01-19 MED ORDER — PROMETHAZINE-DM 6.25-15 MG/5ML PO SYRP: 5.0000 mL | ORAL_SOLUTION | Freq: Four times a day (QID) | ORAL | 0 refills | Status: DC | PRN

## 2023-01-19 NOTE — ED Triage Notes (Signed)
Pt c/o difficulty swallowing, chills, fatigue, migraine, and vomiting last night. Today has a slight cough and fatigue today. States taking otc meds with relief.

## 2023-01-19 NOTE — ED Provider Notes (Signed)
[facility]    CSN: [id number] Arrival date & time: 01/19/23  1926      History   Chief Complaint Chief Complaint  Patient presents with   Sore Throat    HPI Melinda Phillips is a 32 y.o. female.   Patient presents today with a 24 to 36-hour history of URI symptoms.  Reports that she had chills, sore throat, headache, nausea, vomiting, cough.  She denies any significant nasal congestion, abdominal pain, diarrhea.  Reports that she had about 30 minutes of ongoing emesis last night and did have some blood streaks.  She denies frank hemoptysis and has not had any additional symptoms since then.  She has tried ibuprofen as well as over-the-counter medications without improvement of symptoms.  Does report that her children were sick last week.  She has had COVID when the pandemic first began but has not had it more recently.  Denies any history of asthma, allergies, COPD.  She is calm for that she is not pregnant.  She has not had any recent antibiotics or steroids.  Denies any associated melena.    Past Medical History:  Diagnosis Date   History of kidney stones    Mononucleosis    Strep throat 05/2015   Umbilical hernia     Patient Active Problem List   Diagnosis Date Noted   [redacted] weeks gestation of pregnancy 07/17/2020   Fall 05/25/2020   Normal vaginal delivery 09/28/2015   Active labor 09/27/2015    Past Surgical History:  Procedure Laterality Date   NO PAST SURGERIES     UMBILICAL HERNIA REPAIR N/A 07/02/2018   Procedure: LAPAROSCOPIC ASSISTED REPAIR OF UMBILICAL AND SUPRAUMBILICAL HERNIA WITH MESH;  Surgeon: Gaynelle Adu, MD;  Location: [facility];  Service: General;  Laterality: N/A;    OB History     Gravida  2   Para  2   Term  2   Preterm      AB      Living  2      SAB      IAB      Ectopic      Multiple  0   Live Births  2            Home Medications    Prior to Admission medications   Medication Sig Start Date End Date  Taking? Authorizing Provider  ondansetron (ZOFRAN-ODT) 4 MG disintegrating tablet Take 1 tablet (4 mg total) by mouth every 8 (eight) hours as needed for nausea or vomiting. 01/19/23  Yes Sybol Morre K, PA-C  promethazine-dextromethorphan (PROMETHAZINE-DM) 6.25-15 MG/5ML syrup Take 5 mLs by mouth 4 (four) times daily as needed for cough. 01/19/23  Yes Niyana Chesbro, Noberto Retort, PA-C  acetaminophen (TYLENOL) 325 MG tablet Take 2 tablets (650 mg total) by mouth every 4 (four) hours as needed (for pain scale < 4). 07/19/20   Huel Cote, MD  famotidine (PEPCID) 20 MG tablet Take 1 tablet (20 mg total) by mouth 2 (two) times daily. 12/06/19   Rolm Bookbinder, CNM  ibuprofen (ADVIL) 600 MG tablet Take 1 tablet (600 mg total) by mouth every 6 (six) hours. 07/19/20   Huel Cote, MD  loratadine (CLARITIN) 10 MG tablet Take 10 mg by mouth daily as needed for allergies.    [provider]    Family History Family History  Problem Relation Age of Onset   Hypertension Mother    Hypertension Father    Heart disease Father  Heart attack Father    Hyperlipidemia Father    Diabetes Father    Hypertension Maternal Grandmother    Diabetes Maternal Grandmother    Stroke Maternal Grandmother    Heart attack Maternal Grandmother    Hypertension Maternal Grandfather    Hypertension Paternal Grandmother    Hypertension Paternal Grandfather    Heart disease Paternal Grandfather    Heart attack Paternal Grandfather    Diabetes Paternal Grandfather     Social History Social History   Tobacco Use   Smoking status: Former    Current packs/day: 0.00    Types: Cigarettes    Quit date: 09/26/2013    Years since quitting: 9.3   Smokeless tobacco: Never  Vaping Use   Vaping status: Never Used  Substance Use Topics   Alcohol use: No   Drug use: No     Allergies   Bee venom and Cinnamon   Review of Systems Review of Systems  Constitutional:  Positive for activity change, chills and  fatigue. Negative for appetite change and fever.  HENT:  Positive for sore throat. Negative for congestion, sinus pressure and sneezing.   Respiratory:  Positive for cough. Negative for shortness of breath.   Cardiovascular:  Negative for chest pain.  Gastrointestinal:  Positive for nausea and vomiting. Negative for abdominal pain, blood in stool, constipation and diarrhea.  Neurological:  Positive for headaches. Negative for dizziness and light-headedness.     Physical Exam Triage Vital Signs ED Triage Vitals [01/19/23 1949]  Encounter Vitals Group     BP 128/85     Systolic BP Percentile      Diastolic BP Percentile      Pulse Rate 71     Resp 18     Temp 98.5 F (36.9 C)     Temp Source Oral     SpO2 96 %     Weight      Height      Head Circumference      Peak Flow      Pain Score 3     Pain Loc      Pain Education      Exclude from Growth Chart    No data found.  Updated Vital Signs BP 128/85 (BP Location: Right Arm)   Pulse 71   Temp 98.5 F (36.9 C) (Oral)   Resp 18   LMP 01/01/2023 (Exact Date)   SpO2 96%   Breastfeeding No   Visual Acuity Right Eye Distance:   Left Eye Distance:   Bilateral Distance:    Right Eye Near:   Left Eye Near:    Bilateral Near:     Physical Exam Vitals reviewed.  Constitutional:      General: She is awake. She is not in acute distress.    Appearance: Normal appearance. She is well-developed. She is not ill-appearing.     Comments: Very pleasant female appears stated age in no acute distress sitting comfortably in exam room  HENT:     Head: Normocephalic and atraumatic.     Right Ear: Tympanic membrane, ear canal and external ear normal. Tympanic membrane is not erythematous or bulging.     Left Ear: Ear canal and external ear normal. A middle ear effusion is present. Tympanic membrane is not erythematous or bulging.     Nose:     Right Sinus: No maxillary sinus tenderness or frontal sinus tenderness.     Left Sinus:  No maxillary sinus tenderness or frontal sinus  tenderness.     Mouth/Throat:     Pharynx: Uvula midline. Posterior oropharyngeal erythema present. No oropharyngeal exudate or postnasal drip.  Cardiovascular:     Rate and Rhythm: Normal rate and regular rhythm.     Heart sounds: Normal heart sounds, S1 normal and S2 normal. No murmur heard. Pulmonary:     Effort: Pulmonary effort is normal.     Breath sounds: Normal breath sounds. No wheezing, rhonchi or rales.     Comments: Clear to auscultation bilaterally Abdominal:     General: Bowel sounds are normal.     Palpations: Abdomen is soft.     Tenderness: There is no abdominal tenderness. There is no right CVA tenderness, left CVA tenderness, guarding or rebound.  Psychiatric:        Behavior: Behavior is cooperative.      UC Treatments / Results  Labs (all labs ordered are listed, but only abnormal results are displayed) Labs Reviewed  SARS CORONAVIRUS 2 (TAT 6-24 HRS)  CBC  POCT INFLUENZA A/B    EKG   Radiology No results found.  Procedures Procedures (including critical care time)  Medications Ordered in UC Medications - No data to display  Initial Impression / Assessment and Plan / UC Course  I have reviewed the triage vital signs and the nursing notes.  Pertinent labs & imaging results that were available during my care of the patient were reviewed by me and considered in my medical decision making (see chart for details).     Patient is well-appearing, afebrile, nontoxic, nontachycardic.  Vital signs and physical exam are reassuring with no indication for emergent evaluation or imaging.  We discussed that typically hematemesis requires emergency room evaluation.  She has not had any episodes in almost 24 hours and hemodynamically stable.  She denies any associated melena.  She does not believe that emergency room evaluation is necessary at this time and so was given Zofran to help manage her symptoms.  We discussed  that if she has any recurrent nausea/vomiting, recurrent blood in her vomit, melena, abdominal pain she needs to go to the emergency room to which she expressed understanding.  We discussed symptoms are likely viral in nature.  Flu testing was negative.  COVID testing is pending.  She is young and otherwise healthy so not a candidate for antiviral therapy.  Recommended she use over-the-counter medications including Mucinex, Flonase, Tylenol.  She was given Promethazine DM for cough.  We discussed that this can be sedating and she is not to drive or drink alcohol while taking this medication.  We discussed that if her symptoms are improving within a week she is to return for reevaluation.  If at any point she has worsening symptoms including shortness of breath, lightheadedness, recurrent nausea/vomiting despite antiemetic medication, blood in her vomit, melena, hematochezia, weakness, chest pain, worsening cough she must go to the ER immediately to which she expressed understanding.  Strict return precautions given.  Final Clinical Impressions(s) / UC Diagnoses   Final diagnoses:  Upper respiratory tract infection, unspecified type  Nausea and vomiting, unspecified vomiting type  Symptom of blood in vomit     Discharge Instructions      You are negative for flu.  We will contact you if you are positive for COVID.  I will contact you if your blood work is abnormal.  Use Zofran every 8 hours as needed for nausea and vomiting.  Promethazine DM for cough.  This will make you sleepy so do  not drive drink alcohol with taking it.  Avoid NSAIDs including aspirin, ibuprofen/Advil, naproxen/Aleve.  You can use Tylenol, Mucinex, Flonase for symptom relief.  I also recommend nasal saline sinus rinses.  If you have any recurrent nausea/vomiting despite the medication or blood in your vomit you need to go to the emergency room.  If anything worsens and you have shortness of breath, chest pain, nausea/vomiting  despite medication, weakness, dark black stools you need to go to the ER.     ED Prescriptions     Medication Sig Dispense Auth. Provider   promethazine-dextromethorphan (PROMETHAZINE-DM) 6.25-15 MG/5ML syrup Take 5 mLs by mouth 4 (four) times daily as needed for cough. 118 mL Alka Falwell K, PA-C   ondansetron (ZOFRAN-ODT) 4 MG disintegrating tablet Take 1 tablet (4 mg total) by mouth every 8 (eight) hours as needed for nausea or vomiting. 20 tablet Teria Khachatryan, Noberto Retort, PA-C      PDMP not reviewed this encounter.   Jeani Hawking, Cordelia Poche 01/19/23 2033

## 2023-01-19 NOTE — Discharge Instructions (Signed)
You are negative for flu.  We will contact you if you are positive for COVID.  I will contact you if your blood work is abnormal.  Use Zofran every 8 hours as needed for nausea and vomiting.  Promethazine DM for cough.  This will make you sleepy so do not drive drink alcohol with taking it.  Avoid NSAIDs including aspirin, ibuprofen/Advil, naproxen/Aleve.  You can use Tylenol, Mucinex, Flonase for symptom relief.  I also recommend nasal saline sinus rinses.  If you have any recurrent nausea/vomiting despite the medication or blood in your vomit you need to go to the emergency room.  If anything worsens and you have shortness of breath, chest pain, nausea/vomiting despite medication, weakness, dark black stools you need to go to the ER.

## 2023-01-20 LAB — SARS CORONAVIRUS 2 (TAT 6-24 HRS): SARS Coronavirus 2: POSITIVE — AB

## 2023-07-30 ENCOUNTER — Emergency Department (HOSPITAL_COMMUNITY)
Admission: EM | Admit: 2023-07-30 | Discharge: 2023-07-30 | Disposition: A | Discharge status: Home / Self Care | Attending: Emergency Medicine | Admitting: Emergency Medicine

## 2023-07-30 ENCOUNTER — Other Ambulatory Visit: Payer: Self-pay

## 2023-07-30 ENCOUNTER — Emergency Department (HOSPITAL_COMMUNITY)

## 2023-07-30 ENCOUNTER — Encounter (HOSPITAL_COMMUNITY): Payer: Self-pay | Admitting: *Deleted

## 2023-07-30 ENCOUNTER — Encounter (HOSPITAL_COMMUNITY): Payer: Self-pay

## 2023-07-30 ENCOUNTER — Ambulatory Visit (INDEPENDENT_AMBULATORY_CARE_PROVIDER_SITE_OTHER)

## 2023-07-30 ENCOUNTER — Ambulatory Visit (INDEPENDENT_AMBULATORY_CARE_PROVIDER_SITE_OTHER)
Admission: EM | Admit: 2023-07-30 | Discharge: 2023-07-30 | Disposition: A | Discharge status: Home / Self Care | Source: Home / Self Care

## 2023-07-30 DIAGNOSIS — M542 Cervicalgia: Secondary | ICD-10-CM | POA: Insufficient documentation

## 2023-07-30 DIAGNOSIS — R109 Unspecified abdominal pain: Secondary | ICD-10-CM | POA: Diagnosis not present

## 2023-07-30 DIAGNOSIS — R112 Nausea with vomiting, unspecified: Secondary | ICD-10-CM

## 2023-07-30 DIAGNOSIS — H538 Other visual disturbances: Secondary | ICD-10-CM | POA: Diagnosis not present

## 2023-07-30 DIAGNOSIS — R200 Anesthesia of skin: Secondary | ICD-10-CM

## 2023-07-30 DIAGNOSIS — R1011 Right upper quadrant pain: Secondary | ICD-10-CM | POA: Diagnosis not present

## 2023-07-30 DIAGNOSIS — R1010 Upper abdominal pain, unspecified: Secondary | ICD-10-CM | POA: Diagnosis not present

## 2023-07-30 DIAGNOSIS — R202 Paresthesia of skin: Secondary | ICD-10-CM | POA: Insufficient documentation

## 2023-07-30 DIAGNOSIS — R519 Headache, unspecified: Secondary | ICD-10-CM | POA: Insufficient documentation

## 2023-07-30 DIAGNOSIS — R5383 Other fatigue: Secondary | ICD-10-CM | POA: Insufficient documentation

## 2023-07-30 DIAGNOSIS — R21 Rash and other nonspecific skin eruption: Secondary | ICD-10-CM | POA: Insufficient documentation

## 2023-07-30 DIAGNOSIS — R1013 Epigastric pain: Secondary | ICD-10-CM | POA: Diagnosis present

## 2023-07-30 DIAGNOSIS — T50995A Adverse effect of other drugs, medicaments and biological substances, initial encounter: Secondary | ICD-10-CM | POA: Diagnosis not present

## 2023-07-30 LAB — CBC WITH DIFFERENTIAL/PLATELET
Abs Immature Granulocytes: 0.08 10*3/uL — ABNORMAL HIGH (ref 0.00–0.07)
Basophils Absolute: 0.1 10*3/uL (ref 0.0–0.1)
Basophils Relative: 0 %
Eosinophils Absolute: 0.1 10*3/uL (ref 0.0–0.5)
Eosinophils Relative: 0 %
HCT: 42.8 % (ref 36.0–46.0)
Hemoglobin: 15.3 g/dL — ABNORMAL HIGH (ref 12.0–15.0)
Immature Granulocytes: 1 %
Lymphocytes Relative: 13 %
Lymphs Abs: 1.7 10*3/uL (ref 0.7–4.0)
MCH: 33.8 pg (ref 26.0–34.0)
MCHC: 35.7 g/dL (ref 30.0–36.0)
MCV: 94.7 fL (ref 80.0–100.0)
Monocytes Absolute: 0.6 10*3/uL (ref 0.1–1.0)
Monocytes Relative: 5 %
Neutro Abs: 10.3 10*3/uL — ABNORMAL HIGH (ref 1.7–7.7)
Neutrophils Relative %: 81 %
Platelets: 252 10*3/uL (ref 150–400)
RBC: 4.52 MIL/uL (ref 3.87–5.11)
RDW: 12.7 % (ref 11.5–15.5)
WBC: 12.8 10*3/uL — ABNORMAL HIGH (ref 4.0–10.5)
nRBC: 0 % (ref 0.0–0.2)

## 2023-07-30 LAB — URINALYSIS, ROUTINE W REFLEX MICROSCOPIC
Bilirubin Urine: NEGATIVE
Glucose, UA: NEGATIVE mg/dL
Hgb urine dipstick: NEGATIVE
Ketones, ur: NEGATIVE mg/dL
Leukocytes,Ua: NEGATIVE
Nitrite: NEGATIVE
Protein, ur: NEGATIVE mg/dL
Specific Gravity, Urine: 1.024 (ref 1.005–1.030)
pH: 7 (ref 5.0–8.0)

## 2023-07-30 LAB — COMPREHENSIVE METABOLIC PANEL WITH GFR
ALT: 31 U/L (ref 0–44)
AST: 27 U/L (ref 15–41)
Albumin: 4.3 g/dL (ref 3.5–5.0)
Alkaline Phosphatase: 58 U/L (ref 38–126)
Anion gap: 11 (ref 5–15)
BUN: 12 mg/dL (ref 6–20)
CO2: 19 mmol/L — ABNORMAL LOW (ref 22–32)
Calcium: 9.2 mg/dL (ref 8.9–10.3)
Chloride: 106 mmol/L (ref 98–111)
Creatinine, Ser: 0.77 mg/dL (ref 0.44–1.00)
GFR, Estimated: >60 mL/min (ref >60)
Glucose, Bld: 91 mg/dL (ref 70–99)
Potassium: 4.3 mmol/L (ref 3.5–5.1)
Sodium: 136 mmol/L (ref 135–145)
Total Bilirubin: 1.4 mg/dL — ABNORMAL HIGH (ref 0.0–1.2)
Total Protein: 7.3 g/dL (ref 6.5–8.1)

## 2023-07-30 LAB — LIPASE, BLOOD: Lipase: 37 U/L (ref 11–51)

## 2023-07-30 LAB — POCT FASTING CBG KUC MANUAL ENTRY: POCT Glucose (KUC): 83 mg/dL (ref 70–99)

## 2023-07-30 LAB — POCT MONO SCREEN (KUC): Mono, POC: NEGATIVE

## 2023-07-30 LAB — HCG, SERUM, QUALITATIVE: Preg, Serum: NEGATIVE

## 2023-07-30 MED ORDER — ONDANSETRON 4 MG PO TBDP: 4.0000 mg | ORAL_TABLET | Freq: Three times a day (TID) | ORAL | 0 refills | Status: DC | PRN

## 2023-07-30 MED ORDER — PANTOPRAZOLE SODIUM 40 MG PO TBEC: 40.0000 mg | DELAYED_RELEASE_TABLET | Freq: Every day | ORAL | 0 refills | Status: DC

## 2023-07-30 MED ORDER — PROMETHAZINE HCL 25 MG PO TABS: 25.0000 mg | ORAL_TABLET | Freq: Four times a day (QID) | ORAL | 0 refills | Status: DC | PRN

## 2023-07-30 NOTE — ED Triage Notes (Signed)
 Patient reports right upper abd pain n/v headache and dizziness that started a couple weeks ago but getting worse.  Reports pain also radiates to right shoulder and back.  Thought that the symptoms came from stopping myfembree medication but symptoms have continued and worsened.  Patient reports pain is worse when she is up moving.

## 2023-07-30 NOTE — ED Notes (Signed)
 Attempted to make a PCP appt the  locations do not have an appt until may and June, pt declined. The locations with sooner appts are too far from the patients home.

## 2023-07-30 NOTE — Discharge Instructions (Signed)
 Workup thus far has been negative for a source of the right arm numbness, nausea, vomiting, right upper quadrant pain. Since the symptoms are worsening while you are here, we recommend a higher level of care and recommend going to the emergency room for further evaluation.

## 2023-07-30 NOTE — ED Triage Notes (Signed)
 Pt states she was on a new med (myfembree)and since then she has had a lot of side effects. She states she has had N/V spots on skin. She went back to that gyn and had labs done according to pt she has low liver functions. Her gyn advised her to follow up with PCP which she doesn't have. She is now still having nausea, vomiting, headache, blurry vision, back pain and stiff neck. She also states her right arm is numb she can't feel it. She stopped the new meds on 07/11/23.   She did take a zofran this morning.

## 2023-07-30 NOTE — ED Notes (Signed)
 Discharge instructions reviewed with patient. Patient questions answered and opportunity for education reviewed. Patient voices understanding of discharge instructions with no further questions. Patient ambulatory with steady gait to lobby.

## 2023-07-30 NOTE — ED Notes (Signed)
 Patient is being discharged from the Urgent Care and sent to the Emergency Department via POV . Per Lorenzo Romberg, PA-C, patient is in need of higher level of care due to numbness, nausea, headache. Patient is aware and verbalizes understanding of plan of care.  Vitals:   07/30/23 1003  BP: 117/83  Pulse: 78  Resp: 18  Temp: 97.6 F (36.4 C)  SpO2: 97%

## 2023-07-30 NOTE — ED Provider Notes (Signed)
 [place] EMERGENCY DEPARTMENT AT [facility] Provider Note   CSN: [id number] Arrival date & time: 07/30/23  1157     History {Add pertinent medical, surgical, social history, OB history to HPI:1} Chief Complaint  Patient presents with   Abdominal Pain    Melinda Phillips is a 33 y.o. female.  She is presenting here with 2 weeks of upper abdominal stabbing pain associated with nausea and vomiting, initially constipation and now diarrhea.  No fevers or chills.  She has also noticed some pain in her neck and some arm numbness some rash.  She was started on Myfembree back in November for her endometriosis symptoms which were lower back pain.  She said the medicine seemed to help a lot but then started having possible side effects over the last month or so.  She stopped the medicine about 2 and half weeks ago.  Her vomiting persisted today so she presented to urgent care who referred her here for further evaluation.  The history is provided by the patient.  Abdominal Pain Pain location:  Epigastric Pain quality: stabbing   Pain severity:  Moderate Onset quality:  Gradual Duration:  4 weeks Timing:  Intermittent Progression:  Worsening Chronicity:  New Context: not trauma   Associated symptoms: constipation, diarrhea, nausea and vomiting   Associated symptoms: no chest pain, no cough, no dysuria and no fever        Home Medications Prior to Admission medications   Medication Sig Start Date End Date Taking? Authorizing Provider  acetaminophen (TYLENOL) 325 MG tablet Take 2 tablets (650 mg total) by mouth every 4 (four) hours as needed (for pain scale < 4). 07/19/20   Huel Cote, MD  famotidine (PEPCID) 20 MG tablet Take 1 tablet (20 mg total) by mouth 2 (two) times daily. 12/06/19   Rolm Bookbinder, CNM  ibuprofen (ADVIL) 600 MG tablet Take 1 tablet (600 mg total) by mouth every 6 (six) hours. 07/19/20   Huel Cote, MD  levonorgestrel (MIRENA) 20 MCG/DAY IUD 1  each by Intrauterine route once.    [provider]  loratadine (CLARITIN) 10 MG tablet Take 10 mg by mouth daily as needed for allergies.    [provider]  ondansetron (ZOFRAN-ODT) 4 MG disintegrating tablet Take 1 tablet (4 mg total) by mouth every 8 (eight) hours as needed for nausea or vomiting. 01/19/23   Raspet, Noberto Retort, PA-C  promethazine (PHENERGAN) 25 MG tablet Take 1 tablet (25 mg total) by mouth every 6 (six) hours as needed for nausea or vomiting. 07/30/23   Landis Martins, PA-C  promethazine-dextromethorphan (PROMETHAZINE-DM) 6.25-15 MG/5ML syrup Take 5 mLs by mouth 4 (four) times daily as needed for cough. 01/19/23   Raspet, Noberto Retort, PA-C      Allergies    Bee venom and Cinnamon    Review of Systems   Review of Systems  Constitutional:  Negative for fever.  Respiratory:  Negative for cough.   Cardiovascular:  Negative for chest pain.  Gastrointestinal:  Positive for abdominal pain, constipation, diarrhea, nausea and vomiting.  Genitourinary:  Negative for dysuria.    Physical Exam Updated Vital Signs BP (!) 136/99 (BP Location: Right Arm)   Pulse 85   Temp 98 F (36.7 C) (Oral)   Resp 16   Ht 5\' 4"  (1.626 m)   Wt 85.3 kg   SpO2 100%   BMI 32.27 kg/m  Physical Exam Vitals and nursing note reviewed.  Constitutional:  General: She is not in acute distress.    Appearance: Normal appearance. She is well-developed.  HENT:     Head: Normocephalic and atraumatic.  Eyes:     Conjunctiva/sclera: Conjunctivae normal.  Cardiovascular:     Rate and Rhythm: Normal rate and regular rhythm.     Heart sounds: No murmur heard. Pulmonary:     Effort: Pulmonary effort is normal. No respiratory distress.     Breath sounds: Normal breath sounds.  Abdominal:     Palpations: Abdomen is soft.     Tenderness: There is no abdominal tenderness. There is no guarding or rebound.  Musculoskeletal:        General: No swelling.     Cervical back: Neck supple.   Skin:    General: Skin is warm and dry.     Capillary Refill: Capillary refill takes less than 2 seconds.  Neurological:     General: No focal deficit present.     Mental Status: She is alert.     Sensory: No sensory deficit.     Motor: No weakness.     ED Results / Procedures / Treatments   Labs (all labs ordered are listed, but only abnormal results are displayed) Labs Reviewed  COMPREHENSIVE METABOLIC PANEL WITH GFR - Abnormal; Notable for the following components:      Result Value   CO2 19 (*)    Total Bilirubin 1.4 (*)    All other components within normal limits  URINALYSIS, ROUTINE W REFLEX MICROSCOPIC - Abnormal; Notable for the following components:   APPearance HAZY (*)    All other components within normal limits  LIPASE, BLOOD  HCG, SERUM, QUALITATIVE    EKG None  Radiology US Abdomen Limited RUQ (LIVER/GB) Result Date: 07/30/2023 CLINICAL DATA:  Colicky right upper quadrant pain EXAM: ULTRASOUND ABDOMEN LIMITED RIGHT UPPER QUADRANT COMPARISON:  CT 06/04/2018 abdomen pelvis.  Ultrasound 01/31/2015. FINDINGS: Gallbladder: No gallstones or wall thickening visualized. No sonographic Murphy sign noted by sonographer. Common bile duct: Diameter: 3 mm Liver: Mildly echogenic hepatic parenchyma. Portal vein is patent on color Doppler imaging with normal direction of blood flow towards the liver. Other: None. IMPRESSION: No gallstones or ductal dilatation. Mild fatty liver infiltration Electronically Signed   By: Karen Kays M.D.   On: 07/30/2023 15:44   DG Cervical Spine Complete Result Date: 07/30/2023 CLINICAL DATA:  neck pain/right arm numbness at times. EXAM: CERVICAL SPINE - COMPLETE 4+ VIEW COMPARISON:  CT scan cervical spine from 12/23/2016. FINDINGS: There is loss of cervical lordosis, which may be on the basis of positioning or due to muscle spasm, No spondylolisthesis. Vertebral body heights are maintained. No fracture or destructive lesion. The C1 lateral masses  are symmetrically positioned around the odontoid process. No significant degenerative changes. Intervertebral disc heights are maintained. Neural foramina are widely patent. Prevertebral soft tissues within normal limits. IMPRESSION: No acute osseous abnormality of the cervical spine. Electronically Signed   By: Jules Schick M.D.   On: 07/30/2023 11:37    Procedures Procedures  {Document cardiac monitor, telemetry assessment procedure when appropriate:1}  Medications Ordered in ED Medications - No data to display  ED Course/ Medical Decision Making/ A&P   {   Click here for ABCD2, HEART and other calculatorsREFRESH Note before signing :1}                              Medical Decision Making Amount and/or Complexity of  Data Reviewed Labs: ordered.   This patient complains of ***; this involves an extensive number of treatment Options and is a complaint that carries with it a high risk of complications and morbidity. The differential includes ***  I ordered, reviewed and interpreted labs, which included *** I ordered medication *** and reviewed PMP when indicated. I ordered imaging studies which included *** and I independently    visualized and interpreted imaging which showed *** Additional history obtained from *** Previous records obtained and reviewed *** I consulted *** and discussed lab and imaging findings and discussed disposition.  Cardiac monitoring reviewed, *** Social determinants considered, *** Critical Interventions: ***  After the interventions stated above, I reevaluated the patient and found *** Admission and further testing considered, ***   {Document critical care time when appropriate:1} {Document review of labs and clinical decision tools ie heart score, Chads2Vasc2 etc:1}  {Document your independent review of radiology images, and any outside records:1} {Document your discussion with family members, caretakers, and with consultants:1} {Document  social determinants of health affecting pt's care:1} {Document your decision making why or why not admission, treatments were needed:1} Final Clinical Impression(s) / ED Diagnoses Final diagnoses:  None    Rx / DC Orders ED Discharge Orders     None

## 2023-07-30 NOTE — ED Provider Triage Note (Signed)
 Emergency Medicine Provider Triage Evaluation Note  Melinda Phillips , a 33 y.o. female  was evaluated in triage.  Pt complains of RUQ pain, nausea, vomiting, pruritus, low back pain, neck pain, RUE weakness and numbness, RLE weakness and numbness. Was just seen at [facility] and sent to ED for further work up.  Review of Systems  Positive:  Negative:   Physical Exam  BP (!) 136/99 (BP Location: Right Arm)   Pulse 85   Temp 98 F (36.7 C) (Oral)   Resp 16   Ht 5\' 4"  (1.626 m)   Wt 85.3 kg   SpO2 100%   BMI 32.27 kg/m  Gen:   Awake, no distress   Resp:  Normal effort  MSK:   Moves extremities without difficulty  Other:    Medical Decision Making  Medically screening exam initiated at 12:56 PM.  Appropriate orders placed.  Melinda Phillips was informed that the remainder of the evaluation will be completed by another provider, this initial triage assessment does not replace that evaluation, and the importance of remaining in the ED until their evaluation is complete.     Adel Aden, PA-C 07/30/23 1259

## 2023-07-30 NOTE — Discharge Instructions (Addendum)
 Continue to hold the new medication and follow-up with your treatment team for further evaluation.  We are starting you on some acid medication and some nausea medication.  Return if any worsening or concerning symptoms

## 2023-07-30 NOTE — ED Provider Notes (Signed)
 [facility]    CSN: [id number] Arrival date & time: 07/30/23  0931      History   Chief Complaint Chief Complaint  Patient presents with   Medication Reaction   Eye Problem   Headache   Pruritis    HPI Melinda Phillips is a 33 y.o. female.   33 year old female who presents urgent care with numerous complaints that she felt may be secondary to a medication that she was on.  She was started on Myfembree several weeks ago and shortly after began having itching, back pain, neck pain, intermittent right arm numbness, nausea, vomiting, right upper quadrant abdominal pain, yellow spots on her hands.  She discontinued the medication on March 26 and contacted the GYN who had put her on this medication.  They advised her to stop the medication as well.  Due to the skin changes the patient did have lab work ordered last week and had liver function test done.  These were within normal limits although she did have bile acid test done that were out of range per the gynecologist.  The patient has also developed some blurry vision that is intermittent.  Most of her symptoms are intermittent in nature although the nausea, vomiting, back pain and abdominal pain are more persistent.  The yellow spots, itching and blurry vision are mostly resolved.  She has also been fatigued.     Eye Problem Associated symptoms: headaches, nausea and vomiting   Headache Associated symptoms: abdominal pain, back pain, fatigue, nausea, neck pain and vomiting   Associated symptoms: no cough, no ear pain, no eye pain, no fever, no seizures and no sore throat     Past Medical History:  Diagnosis Date   History of kidney stones    Mononucleosis    Strep throat 05/2015   Umbilical hernia     Patient Active Problem List   Diagnosis Date Noted   [redacted] weeks gestation of pregnancy 07/17/2020   Fall 05/25/2020   Normal vaginal delivery 09/28/2015   Active labor 09/27/2015    Past Surgical History:   Procedure Laterality Date   NO PAST SURGERIES     UMBILICAL HERNIA REPAIR N/A 07/02/2018   Procedure: LAPAROSCOPIC ASSISTED REPAIR OF UMBILICAL AND SUPRAUMBILICAL HERNIA WITH MESH;  Surgeon: Gaynelle Adu, MD;  Location: [facility];  Service: General;  Laterality: N/A;    OB History     Gravida  2   Para  2   Term  2   Preterm      AB      Living  2      SAB      IAB      Ectopic      Multiple  0   Live Births  2            Home Medications    Prior to Admission medications   Medication Sig Start Date End Date Taking? Authorizing Provider  levonorgestrel (MIRENA) 20 MCG/DAY IUD 1 each by Intrauterine route once.   Yes [provider]  ondansetron (ZOFRAN-ODT) 4 MG disintegrating tablet Take 1 tablet (4 mg total) by mouth every 8 (eight) hours as needed for nausea or vomiting. 01/19/23  Yes Raspet, Noberto Retort, PA-C  acetaminophen (TYLENOL) 325 MG tablet Take 2 tablets (650 mg total) by mouth every 4 (four) hours as needed (for pain scale < 4). 07/19/20   Huel Cote, MD  famotidine (PEPCID) 20 MG tablet Take 1 tablet (20 mg total) by  mouth 2 (two) times daily. 12/06/19   Rolm Bookbinder, CNM  ibuprofen (ADVIL) 600 MG tablet Take 1 tablet (600 mg total) by mouth every 6 (six) hours. 07/19/20   Huel Cote, MD  loratadine (CLARITIN) 10 MG tablet Take 10 mg by mouth daily as needed for allergies.    [provider]  promethazine-dextromethorphan (PROMETHAZINE-DM) 6.25-15 MG/5ML syrup Take 5 mLs by mouth 4 (four) times daily as needed for cough. 01/19/23   Raspet, Noberto Retort, PA-C    Family History Family History  Problem Relation Age of Onset   Hypertension Mother    Hypertension Father    Heart disease Father    Heart attack Father    Hyperlipidemia Father    Diabetes Father    Hypertension Maternal Grandmother    Diabetes Maternal Grandmother    Stroke Maternal Grandmother    Heart attack Maternal Grandmother    Hypertension Maternal  Grandfather    Hypertension Paternal Grandmother    Hypertension Paternal Grandfather    Heart disease Paternal Grandfather    Heart attack Paternal Grandfather    Diabetes Paternal Grandfather     Social History Social History   Tobacco Use   Smoking status: Former    Current packs/day: 0.00    Types: Cigarettes    Quit date: 09/26/2013    Years since quitting: 9.8   Smokeless tobacco: Never  Vaping Use   Vaping status: Never Used  Substance Use Topics   Alcohol use: No   Drug use: No     Allergies   Bee venom and Cinnamon   Review of Systems Review of Systems  Constitutional:  Positive for fatigue. Negative for chills and fever.  HENT:  Negative for ear pain and sore throat.   Eyes:  Positive for visual disturbance. Negative for pain.  Respiratory:  Negative for cough and shortness of breath.   Cardiovascular:  Negative for chest pain and palpitations.  Gastrointestinal:  Positive for abdominal pain, nausea and vomiting.  Genitourinary:  Negative for dysuria and hematuria.  Musculoskeletal:  Positive for back pain and neck pain. Negative for arthralgias.  Skin:  Positive for color change and rash.  Neurological:  Positive for headaches. Negative for seizures and syncope.  All other systems reviewed and are negative.    Physical Exam Triage Vital Signs ED Triage Vitals  Encounter Vitals Group     BP 07/30/23 1003 117/83     Systolic BP Percentile --      Diastolic BP Percentile --      Pulse Rate 07/30/23 1003 78     Resp 07/30/23 1003 18     Temp 07/30/23 1003 97.6 F (36.4 C)     Temp Source 07/30/23 1003 Oral     SpO2 07/30/23 1003 97 %     Weight --      Height --      Head Circumference --      Peak Flow --      Pain Score 07/30/23 0959 6     Pain Loc --      Pain Education --      Exclude from Growth Chart --    No data found.  Updated Vital Signs BP 117/83 (BP Location: Right Arm)   Pulse 78   Temp 97.6 F (36.4 C) (Oral)   Resp 18    SpO2 97%   Visual Acuity Right Eye Distance: 20/20 Left Eye Distance: 20/25 Bilateral Distance: 20/20  Right Eye Near:  Left Eye Near:    Bilateral Near:     Physical Exam Vitals and nursing note reviewed.  Constitutional:      General: She is not in acute distress.    Appearance: She is well-developed.  HENT:     Head: Normocephalic and atraumatic.  Eyes:     Extraocular Movements: Extraocular movements intact.     Right eye: Normal extraocular motion.     Left eye: Normal extraocular motion.     Conjunctiva/sclera: Conjunctivae normal.     Pupils: Pupils are equal, round, and reactive to light.  Cardiovascular:     Rate and Rhythm: Normal rate and regular rhythm.     Heart sounds: No murmur heard. Pulmonary:     Effort: Pulmonary effort is normal. No respiratory distress.     Breath sounds: Normal breath sounds.  Abdominal:     Palpations: Abdomen is soft.     Tenderness: There is no abdominal tenderness.  Musculoskeletal:        General: No swelling.     Cervical back: Neck supple. Tenderness present. No swelling or deformity. Normal range of motion.     Thoracic back: Tenderness (Generalized) present.     Lumbar back: Tenderness (generalized) present.       Back:  Skin:    General: Skin is warm and dry.     Capillary Refill: Capillary refill takes less than 2 seconds.  Neurological:     Mental Status: She is alert and oriented to person, place, and time.     Cranial Nerves: No cranial nerve deficit or facial asymmetry.     Sensory: No sensory deficit.     Motor: No weakness.     Coordination: Coordination normal.     Gait: Gait normal.  Psychiatric:        Mood and Affect: Mood normal.        Speech: Speech normal.        Behavior: Behavior normal.      UC Treatments / Results  Labs (all labs ordered are listed, but only abnormal results are displayed) Labs Reviewed - No data to display  EKG   Radiology No results  found.  Procedures Procedures (including critical care time)  Medications Ordered in UC Medications - No data to display  Initial Impression / Assessment and Plan / UC Course  I have reviewed the triage vital signs and the nursing notes.  Pertinent labs & imaging results that were available during my care of the patient were reviewed by me and considered in my medical decision making (see chart for details).     Neck pain - Plan: DG Cervical Spine Complete, DG Cervical Spine Complete  Right upper quadrant abdominal pain  Nausea and vomiting, unspecified vomiting type  Right arm numbness  Right leg numbness   Imaging of the neck was negative for acute abnormalities.  Mono was negative.  Blood sugars are 83.  Given the duration of the symptoms of 2 to 3 weeks we did try to work this up as an outpatient however as the patient was sitting in the room her nausea worsened significantly and she began complaining of right leg numbness.  Due to the acutely worsening symptoms including right upper quadrant pain, fatigue, blurry vision, nausea, vomiting, neck pain, back pain, right arm numbness and right leg numbness, we recommended higher level of care where more advanced labs and imaging could be performed.  Patient is agreeable to go to the emergency room now for  further evaluation.  Final Clinical Impressions(s) / UC Diagnoses   Final diagnoses:  None   Discharge Instructions   None    ED Prescriptions   None    PDMP not reviewed this encounter.   Kreg Pesa, [place] 07/30/23 1159

## 2023-11-16 ENCOUNTER — Ambulatory Visit (HOSPITAL_COMMUNITY)
Admission: EM | Admit: 2023-11-16 | Discharge: 2023-11-16 | Disposition: A | Discharge status: Home / Self Care | Attending: Family Medicine | Admitting: Family Medicine

## 2023-11-16 ENCOUNTER — Encounter (HOSPITAL_COMMUNITY): Payer: Self-pay

## 2023-11-16 DIAGNOSIS — R197 Diarrhea, unspecified: Secondary | ICD-10-CM | POA: Diagnosis present

## 2023-11-16 DIAGNOSIS — R1084 Generalized abdominal pain: Secondary | ICD-10-CM | POA: Diagnosis present

## 2023-11-16 LAB — CBC
HCT: 41 % (ref 36.0–46.0)
Hemoglobin: 14.7 g/dL (ref 12.0–15.0)
MCH: 33.6 pg (ref 26.0–34.0)
MCHC: 35.9 g/dL (ref 30.0–36.0)
MCV: 93.8 fL (ref 80.0–100.0)
Platelets: 245 K/uL (ref 150–400)
RBC: 4.37 MIL/uL (ref 3.87–5.11)
RDW: 12.2 % (ref 11.5–15.5)
WBC: 8.3 K/uL (ref 4.0–10.5)
nRBC: 0 % (ref 0.0–0.2)

## 2023-11-16 LAB — COMPREHENSIVE METABOLIC PANEL WITH GFR
ALT: 86 U/L — ABNORMAL HIGH (ref 0–44)
AST: 62 U/L — ABNORMAL HIGH (ref 15–41)
Albumin: 4 g/dL (ref 3.5–5.0)
Alkaline Phosphatase: 65 U/L (ref 38–126)
Anion gap: 10 (ref 5–15)
BUN: 16 mg/dL (ref 6–20)
CO2: 24 mmol/L (ref 22–32)
Calcium: 9.2 mg/dL (ref 8.9–10.3)
Chloride: 106 mmol/L (ref 98–111)
Creatinine, Ser: 0.9 mg/dL (ref 0.44–1.00)
GFR, Estimated: >60 mL/min (ref >60)
Glucose, Bld: 92 mg/dL (ref 70–99)
Potassium: 4.2 mmol/L (ref 3.5–5.1)
Sodium: 140 mmol/L (ref 135–145)
Total Bilirubin: 1.5 mg/dL — ABNORMAL HIGH (ref 0.0–1.2)
Total Protein: 7.1 g/dL (ref 6.5–8.1)

## 2023-11-16 MED ORDER — DICYCLOMINE HCL 20 MG PO TABS: 20.0000 mg | ORAL_TABLET | Freq: Four times a day (QID) | ORAL | 0 refills | Status: DC | PRN

## 2023-11-16 NOTE — Discharge Instructions (Addendum)
 Dicyclomine--take 1 every 6 hours as needed for intestinal cramps  We have drawn blood to check your blood counts and your sodium and potassium and kidney and liver function numbers.  Our staff will notify you if anything is significantly abnormal.  If you see something on your MyChart results that you have a question about, please feel free to call this clinic.  Please bring the stool specimen back to this clinic for us  to send to the hospital lab.  We are open 8-8 Monday through Friday and 10-6 on Saturday and Sunday.  Eat a bland diet and drink plenty of fluids  If you worsen in any way, please go to the emergency room

## 2023-11-16 NOTE — ED Triage Notes (Signed)
 Patient here today with c/o abd pain and diarrhea X 1 week. Patient took an antidiarrhea and that caused her to get constipated so she took a laxative which brought back the diarrhea. Patient has also taken Pepto Bismol with no relief.

## 2023-11-16 NOTE — ED Provider Notes (Signed)
 [facility]    CSN: [id number] Arrival date & time: 11/16/23  1608      History   Chief Complaint Chief Complaint  Patient presents with   Abdominal Pain    HPI Melinda Phillips is a 33 y.o. female.    Abdominal Pain  Here for 8 days of diarrhea and stomach cramping.  On July 24 she began having loose stools that are watery and stomach cramping.  She has not had any nausea or vomiting, though her appetite is decreased.  No fever or chills.  On July 28 she took an over-the-counter antidiarrheal and then she did not have a bowel movement for about 36 hours.  Then she began having frequent stooling again.  She continued to have cramping even during the time she was not having diarrhea.  She had a little blood in her stool yesterday.  No known exposures  No antibiotics that she recalls She has had about 9 or 10 stools so far today and she was seen by 5 in the evening. She is allergic to Myfembree  Her periods are irregular and she has an IUD   Past Medical History:  Diagnosis Date   History of kidney stones    Mononucleosis    Strep throat 05/2015   Umbilical hernia     Patient Active Problem List   Diagnosis Date Noted   [redacted] weeks gestation of pregnancy 07/17/2020   Fall 05/25/2020   Normal vaginal delivery 09/28/2015   Active labor 09/27/2015    Past Surgical History:  Procedure Laterality Date   NO PAST SURGERIES     UMBILICAL HERNIA REPAIR N/A 07/02/2018   Procedure: LAPAROSCOPIC ASSISTED REPAIR OF UMBILICAL AND SUPRAUMBILICAL HERNIA WITH MESH;  Surgeon: Tanda Locus, MD;  Location: [facility];  Service: General;  Laterality: N/A;    OB History     Gravida  2   Para  2   Term  2   Preterm      AB      Living  2      SAB      IAB      Ectopic      Multiple  0   Live Births  2            Home Medications    Prior to Admission medications   Medication Sig Start Date End Date Taking? Authorizing Provider  dicyclomine  (BENTYL) 20 MG tablet Take 1 tablet (20 mg total) by mouth 4 (four) times daily as needed (intestinal cramps). 11/16/23  Yes Gabe Glace K, MD  naproxen sodium (ALEVE) 220 MG tablet Take 220 mg by mouth daily as needed.   Yes [provider]  levonorgestrel (MIRENA) 20 MCG/DAY IUD 1 each by Intrauterine route once.    [provider]    Family History Family History  Problem Relation Age of Onset   Hypertension Mother    Hypertension Father    Heart disease Father    Heart attack Father    Hyperlipidemia Father    Diabetes Father    Hypertension Maternal Grandmother    Diabetes Maternal Grandmother    Stroke Maternal Grandmother    Heart attack Maternal Grandmother    Hypertension Maternal Grandfather    Hypertension Paternal Grandmother    Hypertension Paternal Grandfather    Heart disease Paternal Grandfather    Heart attack Paternal Grandfather    Diabetes Paternal Grandfather     Social History Social History  Tobacco Use   Smoking status: Former    Current packs/day: 0.00    Types: Cigarettes    Quit date: 09/26/2013    Years since quitting: 10.1   Smokeless tobacco: Never  Vaping Use   Vaping status: Never Used  Substance Use Topics   Alcohol use: No   Drug use: No     Allergies   Bee venom, Cinnamon, and Myfembree [relugolix-estradiol-norethindrone]   Review of Systems Review of Systems  Gastrointestinal:  Positive for abdominal pain.     Physical Exam Triage Vital Signs ED Triage Vitals  Encounter Vitals Group     BP 11/16/23 1628 (!) 113/91     Girls Systolic BP Percentile --      Girls Diastolic BP Percentile --      Boys Systolic BP Percentile --      Boys Diastolic BP Percentile --      Pulse Rate 11/16/23 1628 76     Resp 11/16/23 1628 16     Temp 11/16/23 1628 98.2 F (36.8 C)     Temp Source 11/16/23 1628 Oral     SpO2 11/16/23 1628 97 %     Weight --      Height --      Head Circumference --      Peak Flow --       Pain Score 11/16/23 1629 9     Pain Loc --      Pain Education --      Exclude from Growth Chart --    No data found.  Updated Vital Signs BP (!) 113/91 (BP Location: Left Arm)   Pulse 76   Temp 98.2 F (36.8 C) (Oral)   Resp 16   LMP 05/11/2023 (Approximate)   SpO2 97%   Visual Acuity Right Eye Distance:   Left Eye Distance:   Bilateral Distance:    Right Eye Near:   Left Eye Near:    Bilateral Near:     Physical Exam Vitals reviewed.  Constitutional:      General: She is not in acute distress.    Appearance: She is not toxic-appearing.  HENT:     Mouth/Throat:     Mouth: Mucous membranes are moist.     Pharynx: No oropharyngeal exudate or posterior oropharyngeal erythema.  Eyes:     Extraocular Movements: Extraocular movements intact.     Conjunctiva/sclera: Conjunctivae normal.     Pupils: Pupils are equal, round, and reactive to light.  Cardiovascular:     Rate and Rhythm: Normal rate and regular rhythm.     Heart sounds: No murmur heard. Pulmonary:     Effort: No respiratory distress.     Breath sounds: No stridor. No wheezing, rhonchi or rales.  Abdominal:     Palpations: Abdomen is soft.     Comments: She has tenderness on exam in both periumbilical areas and in her epigastrium.  No mass or guarding.  Musculoskeletal:     Cervical back: Neck supple.  Lymphadenopathy:     Cervical: No cervical adenopathy.  Skin:    Capillary Refill: Capillary refill takes less than 2 seconds.     Coloration: Skin is not jaundiced or pale.  Neurological:     General: No focal deficit present.     Mental Status: She is alert and oriented to person, place, and time.  Psychiatric:        Behavior: Behavior normal.      UC Treatments / Results  Labs (all  labs ordered are listed, but only abnormal results are displayed) Labs Reviewed  CBC  COMPREHENSIVE METABOLIC PANEL WITH GFR    EKG   Radiology No results found.  Procedures Procedures (including  critical care time)  Medications Ordered in UC Medications - No data to display  Initial Impression / Assessment and Plan / UC Course  I have reviewed the triage vital signs and the nursing notes.  Pertinent labs & imaging results that were available during my care of the patient were reviewed by me and considered in my medical decision making (see chart for details).     CBC and CMP are drawn here and staff will notify her if anything is significantly abnormal.  Stool collection supplies are provided here and GI pathogen panel and C. difficile are ordered.  Bentyl was sent in for the cramping. She was given contact information for gastroenterology Final Clinical Impressions(s) / UC Diagnoses   Final diagnoses:  Generalized abdominal pain  Diarrhea, unspecified type     Discharge Instructions      Dicyclomine--take 1 every 6 hours as needed for intestinal cramps  We have drawn blood to check your blood counts and your sodium and potassium and kidney and liver function numbers.  Our staff will notify you if anything is significantly abnormal.  If you see something on your MyChart results that you have a question about, please feel free to call this clinic.  Please bring the stool specimen back to this clinic for us  to send to the hospital lab.  We are open 8-8 Monday through Friday and 10-6 on Saturday and Sunday.  Eat a bland diet and drink plenty of fluids  If you worsen in any way, please go to the emergency room    ED Prescriptions     Medication Sig Dispense Auth. Provider   dicyclomine (BENTYL) 20 MG tablet Take 1 tablet (20 mg total) by mouth 4 (four) times daily as needed (intestinal cramps). 20 tablet Shedrick Sarli K, MD      PDMP not reviewed this encounter.   Vonna Sharlet POUR, MD 11/16/23 [phone]

## 2023-11-18 ENCOUNTER — Telehealth: Admitting: Physician Assistant

## 2023-11-18 ENCOUNTER — Telehealth (HOSPITAL_COMMUNITY): Payer: Self-pay

## 2023-11-18 ENCOUNTER — Other Ambulatory Visit (HOSPITAL_COMMUNITY)
Admission: RE | Admit: 2023-11-18 | Discharge: 2023-11-18 | Disposition: A | Discharge status: Home / Self Care | Attending: Family Medicine | Admitting: Family Medicine

## 2023-11-18 DIAGNOSIS — R112 Nausea with vomiting, unspecified: Secondary | ICD-10-CM | POA: Diagnosis present

## 2023-11-18 LAB — C DIFFICILE QUICK SCREEN W PCR REFLEX
C Diff antigen: NEGATIVE
C Diff interpretation: NOT DETECTED
C Diff toxin: NEGATIVE

## 2023-11-18 NOTE — Progress Notes (Signed)
  Because your symptoms have become worse since your Urgent Care visit, I feel your condition warrants further evaluation and I recommend that you be seen in person for evalution.    NOTE: There will be NO CHARGE for this E-Visit   If you are having a true medical emergency, please call 911.    Urgent Care - [place], [place], [place], [place], [place], [place]  [place]     Your MyChart E-visit questionnaire answers were reviewed by a board certified advanced clinical practitioner to complete your personal care plan based on your specific symptoms.    Thank you for using e-Visits.

## 2023-11-18 NOTE — Telephone Encounter (Signed)
 Microbiology lab calling in for the stool samples orders to be released as they now have a sample. Telephone encounter opened to release the orders for C Diff and GI panel

## 2023-11-19 ENCOUNTER — Ambulatory Visit: Payer: Self-pay

## 2023-11-19 LAB — GASTROINTESTINAL PANEL BY PCR, STOOL (REPLACES STOOL CULTURE)

## 2023-12-07 ENCOUNTER — Emergency Department (HOSPITAL_BASED_OUTPATIENT_CLINIC_OR_DEPARTMENT_OTHER)
Admission: EM | Admit: 2023-12-07 | Discharge: 2023-12-08 | Disposition: A | Discharge status: Home / Self Care | Payer: Self-pay | Attending: Emergency Medicine | Admitting: Emergency Medicine

## 2023-12-07 DIAGNOSIS — R1032 Left lower quadrant pain: Secondary | ICD-10-CM | POA: Insufficient documentation

## 2023-12-07 DIAGNOSIS — K5900 Constipation, unspecified: Secondary | ICD-10-CM | POA: Insufficient documentation

## 2023-12-07 DIAGNOSIS — Z87891 Personal history of nicotine dependence: Secondary | ICD-10-CM | POA: Insufficient documentation

## 2023-12-07 DIAGNOSIS — R109 Unspecified abdominal pain: Secondary | ICD-10-CM

## 2023-12-07 DIAGNOSIS — R197 Diarrhea, unspecified: Secondary | ICD-10-CM | POA: Insufficient documentation

## 2023-12-07 LAB — CBC
HCT: 39.4 % (ref 36.0–46.0)
Hemoglobin: 14.3 g/dL (ref 12.0–15.0)
MCH: 33.9 pg (ref 26.0–34.0)
MCHC: 36.3 g/dL — ABNORMAL HIGH (ref 30.0–36.0)
MCV: 93.4 fL (ref 80.0–100.0)
Platelets: 246 K/uL (ref 150–400)
RBC: 4.22 MIL/uL (ref 3.87–5.11)
RDW: 12.5 % (ref 11.5–15.5)
WBC: 12.1 K/uL — ABNORMAL HIGH (ref 4.0–10.5)
nRBC: 0 % (ref 0.0–0.2)

## 2023-12-07 LAB — PREGNANCY, URINE: Preg Test, Ur: NEGATIVE

## 2023-12-07 LAB — URINALYSIS, ROUTINE W REFLEX MICROSCOPIC
Bilirubin Urine: NEGATIVE
Glucose, UA: NEGATIVE mg/dL
Hgb urine dipstick: NEGATIVE
Ketones, ur: NEGATIVE mg/dL
Nitrite: NEGATIVE
Specific Gravity, Urine: 1.039 — ABNORMAL HIGH (ref 1.005–1.030)
pH: 7 (ref 5.0–8.0)

## 2023-12-07 LAB — COMPREHENSIVE METABOLIC PANEL WITH GFR
ALT: 27 U/L (ref 0–44)
AST: 23 U/L (ref 15–41)
Albumin: 4.4 g/dL (ref 3.5–5.0)
Alkaline Phosphatase: 82 U/L (ref 38–126)
Anion gap: 14 (ref 5–15)
BUN: 19 mg/dL (ref 6–20)
CO2: 22 mmol/L (ref 22–32)
Calcium: 9 mg/dL (ref 8.9–10.3)
Chloride: 103 mmol/L (ref 98–111)
Creatinine, Ser: 0.88 mg/dL (ref 0.44–1.00)
GFR, Estimated: >60 mL/min (ref >60)
Glucose, Bld: 90 mg/dL (ref 70–99)
Potassium: 3.8 mmol/L (ref 3.5–5.1)
Sodium: 138 mmol/L (ref 135–145)
Total Bilirubin: 0.9 mg/dL (ref 0.0–1.2)
Total Protein: 6.8 g/dL (ref 6.5–8.1)

## 2023-12-07 LAB — LIPASE, BLOOD: Lipase: 56 U/L — ABNORMAL HIGH (ref 11–51)

## 2023-12-07 MED ORDER — KETOROLAC TROMETHAMINE 15 MG/ML IJ SOLN
15.0000 mg | Freq: Once | INTRAMUSCULAR | Status: AC
  Administered 2023-12-07: 15 mg via INTRAVENOUS
  Filled 2023-12-07: qty 1

## 2023-12-07 MED ORDER — ONDANSETRON HCL 4 MG/2ML IJ SOLN
4.0000 mg | Freq: Once | INTRAMUSCULAR | Status: AC
  Administered 2023-12-07: 4 mg via INTRAVENOUS
  Filled 2023-12-07: qty 2

## 2023-12-07 MED ORDER — SODIUM CHLORIDE 0.9 % IV BOLUS
1000.0000 mL | Freq: Once | INTRAVENOUS | Status: AC
  Administered 2023-12-07: 1000 mL via INTRAVENOUS

## 2023-12-07 NOTE — ED Provider Notes (Signed)
 [facility] Emergency Department Provider Note MRN:  [id number]  Arrival date & time: 12/08/23     Chief Complaint   Abdominal Pain   History of Present Illness   Melinda Phillips is a 33 y.o. year-old female with a history of kidney stones presenting to the ED with chief complaint of abdominal pain.  Left flank and left upper quadrant and left lower quadrant abdominal pain for the past several days, getting much worse tonight, not going away.  Has been having frequent constipation and diarrhea over the past month or more.  Review of Systems  A thorough review of systems was obtained and all systems are negative except as noted in the HPI and PMH.   Patient's Health History    Past Medical History:  Diagnosis Date   History of kidney stones    Mononucleosis    Strep throat 05/2015   Umbilical hernia     Past Surgical History:  Procedure Laterality Date   NO PAST SURGERIES     UMBILICAL HERNIA REPAIR N/A 07/02/2018   Procedure: LAPAROSCOPIC ASSISTED REPAIR OF UMBILICAL AND SUPRAUMBILICAL HERNIA WITH MESH;  Surgeon: Tanda Locus, MD;  Location: [facility];  Service: General;  Laterality: N/A;    Family History  Problem Relation Age of Onset   Hypertension Mother    Hypertension Father    Heart disease Father    Heart attack Father    Hyperlipidemia Father    Diabetes Father    Hypertension Maternal Grandmother    Diabetes Maternal Grandmother    Stroke Maternal Grandmother    Heart attack Maternal Grandmother    Hypertension Maternal Grandfather    Hypertension Paternal Grandmother    Hypertension Paternal Grandfather    Heart disease Paternal Grandfather    Heart attack Paternal Grandfather    Diabetes Paternal Grandfather     Social History   Socioeconomic History   Marital status: Married    Spouse name: Not on file   Number of children: Not on file   Years of education: Not on file   Highest education level: Not on file  Occupational  History   Not on file  Tobacco Use   Smoking status: Former    Current packs/day: 0.00    Types: Cigarettes    Quit date: 09/26/2013    Years since quitting: 10.2   Smokeless tobacco: Never  Vaping Use   Vaping status: Never Used  Substance and Sexual Activity   Alcohol use: No   Drug use: No   Sexual activity: Yes    Birth control/protection: None  Other Topics Concern   Not on file  Social History Narrative   Not on file   Social Drivers of Health   Financial Resource Strain: Not on file  Food Insecurity: Not on file  Transportation Needs: Not on file  Physical Activity: Not on file  Stress: Not on file  Social Connections: Not on file  Intimate Partner Violence: Not on file     Physical Exam   Vitals:   12/07/23 2300 12/07/23 2330  BP: 134/86 (!) 140/89  Pulse: 74 74  Resp:  20  Temp:    SpO2: 99% 100%    CONSTITUTIONAL: Well-appearing, NAD NEURO/PSYCH:  Alert and oriented x 3, no focal deficits EYES:  eyes equal and reactive ENT/NECK:  no LAD, no JVD CARDIO: Regular rate, well-perfused, normal S1 and S2 PULM:  CTAB no wheezing or rhonchi GI/GU:  non-distended, non-tender MSK/SPINE:  No gross deformities,  no edema SKIN:  no rash, atraumatic   *Additional and/or pertinent findings included in MDM below  Diagnostic and Interventional Summary    EKG Interpretation Date/Time:    Ventricular Rate:    PR Interval:    QRS Duration:    QT Interval:    QTC Calculation:   R Axis:      Text Interpretation:         Labs Reviewed  LIPASE, BLOOD - Abnormal; Notable for the following components:      Result Value   Lipase 56 (*)    All other components within normal limits  CBC - Abnormal; Notable for the following components:   WBC 12.1 (*)    MCHC 36.3 (*)    All other components within normal limits  URINALYSIS, ROUTINE W REFLEX MICROSCOPIC - Abnormal; Notable for the following components:   APPearance HAZY (*)    Specific Gravity, Urine 1.039  (*)    Protein, ur TRACE (*)    Leukocytes,Ua TRACE (*)    Bacteria, UA RARE (*)    All other components within normal limits  COMPREHENSIVE METABOLIC PANEL WITH GFR  PREGNANCY, URINE    CT ABDOMEN PELVIS W CONTRAST  Final Result      Medications  sodium chloride  0.9 % bolus 1,000 mL (0 mLs Intravenous Stopped 12/08/23 0114)  ondansetron  (ZOFRAN ) injection 4 mg (4 mg Intravenous Given 12/07/23 2357)  ketorolac  (TORADOL ) 15 MG/ML injection 15 mg (15 mg Intravenous Given 12/07/23 2358)  iohexol  (OMNIPAQUE ) 300 MG/ML solution 100 mL (100 mLs Intravenous Contrast Given 12/08/23 0027)     Procedures  /  Critical Care Procedures  ED Course and Medical Decision Making  Initial Impression and Ddx Differential diagnosis includes ovarian cyst, diverticulitis, gastritis, kidney stone  Past medical/surgical history that increases complexity of ED encounter: None  Interpretation of Diagnostics I personally reviewed the Laboratory Testing and my interpretation is as follows: No significant blood count or electrolyte disturbance.  CT largely unremarkable, patient made aware of the atypical location of her IUD.  Some constipation, no emergent findings to explain her pain.  Patient Reassessment and Ultimate Disposition/Management     IBS is considered, patient has follow-up next week, no emergent process appropriate for discharge.  Patient management required discussion with the following services or consulting groups:  None  Complexity of Problems Addressed Acute illness or injury that poses threat of life of bodily function  Additional Data Reviewed and Analyzed Further history obtained from: None  Additional Factors Impacting ED Encounter Risk Prescriptions  Ozell HERO. Theadore, MD [facility] Emergency Medicine [facility] [email or web address]  Final Clinical Impressions(s) / ED Diagnoses     ICD-10-CM   1. Flank pain  R10.9       ED Discharge Orders           Ordered    loperamide  (IMODIUM ) 2 MG capsule  4 times daily PRN,   Status:  Discontinued        12/08/23 0124    ondansetron  (ZOFRAN -ODT) 4 MG disintegrating tablet  Every 8 hours PRN        12/08/23 0124    dicyclomine  (BENTYL ) 20 MG tablet  2 times daily        12/08/23 0124    loperamide  (IMODIUM ) 2 MG capsule  4 times daily PRN        12/08/23 0125    polyethylene glycol (MIRALAX ) 17 g packet  Daily        12/08/23  [id number]             Discharge Instructions Discussed with and Provided to Patient:     Discharge Instructions      You were evaluated in the Emergency Department and after careful evaluation, we did not find any emergent condition requiring admission or further testing in the hospital.  Your exam/testing today is overall reassuring.  Recommend keeping your follow-up with your primary care doctor to discuss your symptoms.  Can use Zofran  as needed for nausea, can use the Bentyl  as needed for abdominal pain, use the MiraLAX  for constipation up to 5 times daily until you achieve soft frequent stools.  Then you can decrease to once daily.  Please return to the Emergency Department if you experience any worsening of your condition.   Thank you for allowing us  to be a part of your care.       Theadore Ozell HERO, MD 12/08/23 [phone]

## 2023-12-07 NOTE — ED Triage Notes (Signed)
 Back pain several months. HX enlarged ovaries. Diarrhea x4 weeks. Developing intense left flank pain radiating into from abd.

## 2023-12-08 ENCOUNTER — Emergency Department (HOSPITAL_BASED_OUTPATIENT_CLINIC_OR_DEPARTMENT_OTHER): Payer: Self-pay

## 2023-12-08 MED ORDER — IOHEXOL 300 MG/ML  SOLN
100.0000 mL | Freq: Once | INTRAMUSCULAR | Status: AC | PRN
  Administered 2023-12-08: 100 mL via INTRAVENOUS

## 2023-12-08 MED ORDER — ONDANSETRON 4 MG PO TBDP: 4.0000 mg | ORAL_TABLET | Freq: Three times a day (TID) | ORAL | 0 refills | Status: AC | PRN

## 2023-12-08 MED ORDER — POLYETHYLENE GLYCOL 3350 17 G PO PACK: 17.0000 g | PACK | Freq: Every day | ORAL | 1 refills | Status: AC

## 2023-12-08 MED ORDER — LOPERAMIDE HCL 2 MG PO CAPS: 2.0000 mg | ORAL_CAPSULE | Freq: Four times a day (QID) | ORAL | 0 refills | Status: AC | PRN

## 2023-12-08 MED ORDER — DICYCLOMINE HCL 20 MG PO TABS
20.0000 mg | ORAL_TABLET | Freq: Two times a day (BID) | ORAL | 0 refills | Status: AC
Start: 2023-12-08 — End: ?

## 2023-12-08 MED ORDER — LOPERAMIDE HCL 2 MG PO CAPS: 2.0000 mg | ORAL_CAPSULE | Freq: Four times a day (QID) | ORAL | 0 refills | Status: DC | PRN

## 2023-12-08 NOTE — ED Notes (Signed)
 Patient transported to CT

## 2023-12-08 NOTE — ED Notes (Signed)
 ED Provider at bedside.

## 2023-12-08 NOTE — Discharge Instructions (Addendum)
 You were evaluated in the Emergency Department and after careful evaluation, we did not find any emergent condition requiring admission or further testing in the hospital.  Your exam/testing today is overall reassuring.  Recommend keeping your follow-up with your primary care doctor to discuss your symptoms.  Can use Zofran  as needed for nausea, can use the Bentyl  as needed for abdominal pain, use the MiraLAX  for constipation up to 5 times daily until you achieve soft frequent stools.  Then you can decrease to once daily.  Please return to the Emergency Department if you experience any worsening of your condition.   Thank you for allowing us  to be a part of your care.

## 2023-12-21 ENCOUNTER — Ambulatory Visit: Admitting: Internal Medicine

## 2024-03-18 ENCOUNTER — Other Ambulatory Visit: Payer: Self-pay

## 2024-03-18 ENCOUNTER — Emergency Department (HOSPITAL_BASED_OUTPATIENT_CLINIC_OR_DEPARTMENT_OTHER)

## 2024-03-18 ENCOUNTER — Encounter (HOSPITAL_BASED_OUTPATIENT_CLINIC_OR_DEPARTMENT_OTHER): Payer: Self-pay | Admitting: Emergency Medicine

## 2024-03-18 ENCOUNTER — Emergency Department (HOSPITAL_BASED_OUTPATIENT_CLINIC_OR_DEPARTMENT_OTHER)
Admission: EM | Admit: 2024-03-18 | Discharge: 2024-03-18 | Disposition: A | Discharge status: Home / Self Care | Attending: Emergency Medicine | Admitting: Emergency Medicine

## 2024-03-18 DIAGNOSIS — M25561 Pain in right knee: Secondary | ICD-10-CM | POA: Diagnosis present

## 2024-03-18 MED ORDER — NAPROXEN 500 MG PO TABS: 500.0000 mg | ORAL_TABLET | Freq: Two times a day (BID) | ORAL | 0 refills | Status: AC

## 2024-03-18 NOTE — ED Triage Notes (Signed)
 Pt via pov from home after tweaking her right knee coming down the stairs. She reports that she is able to walk on it with no problem, but when she tries to go up and down stairs. She also reports that she she had it lock earlier today and had to have a coworker help put the knee back in position. Pt a&o x 4; nad noted.

## 2024-03-18 NOTE — Discharge Instructions (Signed)
 It does sound like it could be the meniscus.  Hopefully just gets better with rest and over-the-counter medications.  I have given you information to follow-up with orthopedics in clinic if you so choose.  Take 4 over the counter ibuprofen  tablets 3 times a day or 2 over-the-counter naproxen tablets twice a day for pain. Also take tylenol  1000mg (2 extra strength) four times a day.

## 2024-03-18 NOTE — ED Notes (Signed)
Patient verbalizes understanding of discharge instructions. Opportunity for questioning and answers were provided. Armband removed by staff, pt discharged from ED. Ambulated out to lobby with crutches  

## 2024-03-18 NOTE — ED Provider Notes (Signed)
 [place] EMERGENCY DEPARTMENT AT [facility] Provider Note   CSN: [id number] Arrival date & time: 03/18/24  8084     Patient presents with: Knee Pain   Melinda Phillips is a 33 y.o. female.   33 yo F with a chief complaints of right knee pain.  Patient was going down the stairs and she felt a sudden sharp pain to her knee just under the kneecap.  Since then she has pain while trying to go up and down stairs.  She had 1 episode where she felt like it got locked in the place and had some assistance to get moving again.  Can walk otherwise without issue.  Has been wearing a knee sleeve for some support.  Trying Tylenol  and NSAIDs with some improvement.   Knee Pain      Prior to Admission medications   Medication Sig Start Date End Date Taking? Authorizing Provider  naproxen (NAPROSYN) 500 MG tablet Take 1 tablet (500 mg total) by mouth 2 (two) times daily. 03/18/24  Yes Emil Share, DO  dicyclomine  (BENTYL ) 20 MG tablet Take 1 tablet (20 mg total) by mouth 2 (two) times daily. 12/08/23   Theadore Ozell HERO, MD  levonorgestrel (MIRENA) 20 MCG/DAY IUD 1 each by Intrauterine route once.    [provider]  loperamide  (IMODIUM ) 2 MG capsule Take 1 capsule (2 mg total) by mouth 4 (four) times daily as needed for diarrhea or loose stools. 12/08/23   Theadore Ozell HERO, MD  naproxen sodium (ALEVE) 220 MG tablet Take 220 mg by mouth daily as needed.    [provider]  ondansetron  (ZOFRAN -ODT) 4 MG disintegrating tablet Take 1 tablet (4 mg total) by mouth every 8 (eight) hours as needed for nausea or vomiting. 12/08/23   Theadore Ozell HERO, MD  polyethylene glycol (MIRALAX ) 17 g packet Take 17 g by mouth daily. 12/08/23   Theadore Ozell HERO, MD    Allergies: Bee venom, Cinnamon, and Myfembree [relugolix-estradiol-norethindrone]    Review of Systems  Updated Vital Signs BP 117/87 (BP Location: Right Arm)   Pulse 88   Temp 98 F (36.7 C)   Resp 16   Ht 5' 4 (1.626 m)   Wt  85.3 kg   SpO2 99%   BMI 32.28 kg/m   Physical Exam Vitals and nursing note reviewed.  Constitutional:      General: She is not in acute distress.    Appearance: She is well-developed. She is not diaphoretic.  HENT:     Head: Normocephalic and atraumatic.  Eyes:     Pupils: Pupils are equal, round, and reactive to light.  Cardiovascular:     Rate and Rhythm: Normal rate and regular rhythm.     Heart sounds: No murmur heard.    No friction rub. No gallop.  Pulmonary:     Effort: Pulmonary effort is normal.     Breath sounds: No wheezing or rales.  Abdominal:     General: There is no distension.     Palpations: Abdomen is soft.     Tenderness: There is no abdominal tenderness.  Musculoskeletal:        General: No tenderness.     Cervical back: Normal range of motion and neck supple.     Comments: Full range of motion of left knee without obvious discomfort.  Negative McMurray's.  No obvious ligamentous laxity.  Pulse motor and sensation intact.  Skin:    General: Skin is warm and dry.  Neurological:  Mental Status: She is alert and oriented to person, place, and time.  Psychiatric:        Behavior: Behavior normal.     (all labs ordered are listed, but only abnormal results are displayed) Labs Reviewed - No data to display  EKG: None  Radiology: DG Knee Right Port Result Date: 03/18/2024 CLINICAL DATA:  Right knee pain. EXAM: PORTABLE RIGHT KNEE - 1-2 VIEW COMPARISON:  None Available. FINDINGS: No evidence of fracture, dislocation, or joint effusion. The alignment and joint spaces are normal. No evidence of arthropathy or other focal bone abnormality. Soft tissues are unremarkable. IMPRESSION: Negative radiographs of the right knee. Electronically Signed   By: Andrea Gasman M.D.   On: 03/18/2024 20:05     Procedures   Medications Ordered in the ED - No data to display                                  Medical Decision Making Amount and/or Complexity of  Data Reviewed Radiology: ordered.  Risk Prescription drug management.   33 yO F with a chief complaint of right knee pain.  By history could be meniscal injury.  I do not appreciate an obvious positive McMurray's test.  Crutches.  Tylenol  and NSAIDs.  Orthopedic follow-up.  10:41 PM:  I have discussed the diagnosis/risks/treatment options with the patient.  Evaluation and diagnostic testing in the emergency department does not suggest an emergent condition requiring admission or immediate intervention beyond what has been performed at this time.  They will follow up with Ortho, PCP. We also discussed returning to the ED immediately if new or worsening sx occur. We discussed the sx which are most concerning (e.g., sudden worsening pain, fever, inability to tolerate by mouth) that necessitate immediate return. Medications administered to the patient during their visit and any new prescriptions provided to the patient are listed below.  Medications given during this visit Medications - No data to display   The patient appears reasonably screen and/or stabilized for discharge and I doubt any other medical condition or other [facility] requiring further screening, evaluation, or treatment in the ED at this time prior to discharge.       Final diagnoses:  Acute pain of right knee    ED Discharge Orders          Ordered    naproxen (NAPROSYN) 500 MG tablet  2 times daily        03/18/24 2038               Emil Share, DO 03/18/24 2241

## 2024-03-19 ENCOUNTER — Ambulatory Visit (HOSPITAL_BASED_OUTPATIENT_CLINIC_OR_DEPARTMENT_OTHER): Admitting: Student

## 2024-03-19 DIAGNOSIS — M25561 Pain in right knee: Secondary | ICD-10-CM

## 2024-03-19 MED ORDER — TRIAMCINOLONE ACETONIDE 40 MG/ML IJ SUSP
2.0000 mL | INTRAMUSCULAR | Status: AC | PRN
  Administered 2024-03-19: 2 mL via INTRA_ARTICULAR

## 2024-03-19 MED ORDER — LIDOCAINE HCL 1 % IJ SOLN
4.0000 mL | INTRAMUSCULAR | Status: AC | PRN
  Administered 2024-03-19: 4 mL

## 2024-03-19 NOTE — Progress Notes (Signed)
 Chief Complaint: Right knee pain    Discussed the use of AI scribe software for clinical note transcription with the patient, who gave verbal consent to proceed.  History of Present Illness Melinda Phillips is a 33 year old female who presents with right knee pain following an injury while climbing a ladder.  She developed right knee pain on Saturday after a misstep on her mother's attic ladder caused her foot to shift to the right and she was unable to bear weight on the knee to climb back onto the ladder. She can walk and run without pain but has significant pain with climbing stairs or ladders. She notes clicking and popping in the right knee, worse after sitting for prolonged periods, followed by throbbing pain. She has been using rest, ice, compression, elevation, a compression knee sleeve, and naproxen 500 mg, with limited relief. She denies prior knee problems or surgeries. Pain is improved when standing and when sleeping with the knee slightly bent and is worse with elevation and prolonged sitting. She works at [employer] job that requires long periods of sitting, which aggravates the knee pain.   Surgical History:   None  PMH/PSH/Family History/Social History/Meds/Allergies:    Past Medical History:  Diagnosis Date   History of kidney stones    Mononucleosis    Strep throat 05/2015   Umbilical hernia    Past Surgical History:  Procedure Laterality Date   NO PAST SURGERIES     UMBILICAL HERNIA REPAIR N/A 07/02/2018   Procedure: LAPAROSCOPIC ASSISTED REPAIR OF UMBILICAL AND SUPRAUMBILICAL HERNIA WITH MESH;  Surgeon: Tanda Locus, MD;  Location: [facility];  Service: General;  Laterality: N/A;   Social History   Socioeconomic History   Marital status: Married    Spouse name: Not on file   Number of children: Not on file   Years of education: Not on file   Highest education level: Not on file  Occupational History   Not on file  Tobacco Use    Smoking status: Former    Current packs/day: 0.00    Types: Cigarettes    Quit date: 09/26/2013    Years since quitting: 10.4   Smokeless tobacco: Never  Vaping Use   Vaping status: Never Used  Substance and Sexual Activity   Alcohol use: No   Drug use: No   Sexual activity: Yes    Birth control/protection: None  Other Topics Concern   Not on file  Social History Narrative   Not on file   Social Drivers of Health   Financial Resource Strain: Not on file  Food Insecurity: Not on file  Transportation Needs: Not on file  Physical Activity: Not on file  Stress: Not on file  Social Connections: Not on file   Family History  Problem Relation Age of Onset   Hypertension Mother    Hypertension Father    Heart disease Father    Heart attack Father    Hyperlipidemia Father    Diabetes Father    Hypertension Maternal Grandmother    Diabetes Maternal Grandmother    Stroke Maternal Grandmother    Heart attack Maternal Grandmother    Hypertension Maternal Grandfather    Hypertension Paternal Grandmother    Hypertension Paternal Grandfather    Heart disease Paternal Grandfather    Heart attack  Paternal Grandfather    Diabetes Paternal Grandfather    Allergies  Allergen Reactions   Bee Venom Anaphylaxis   Cinnamon Anaphylaxis   Myfembree [Relugolix-Estradiol-Norethindrone] Itching   Current Outpatient Medications  Medication Sig Dispense Refill   dicyclomine  (BENTYL ) 20 MG tablet Take 1 tablet (20 mg total) by mouth 2 (two) times daily. 20 tablet 0   levonorgestrel (MIRENA) 20 MCG/DAY IUD 1 each by Intrauterine route once.     loperamide  (IMODIUM ) 2 MG capsule Take 1 capsule (2 mg total) by mouth 4 (four) times daily as needed for diarrhea or loose stools. 12 capsule 0   naproxen (NAPROSYN) 500 MG tablet Take 1 tablet (500 mg total) by mouth 2 (two) times daily. 30 tablet 0   naproxen sodium (ALEVE) 220 MG tablet Take 220 mg by mouth daily as needed.     ondansetron   (ZOFRAN -ODT) 4 MG disintegrating tablet Take 1 tablet (4 mg total) by mouth every 8 (eight) hours as needed for nausea or vomiting. 20 tablet 0   polyethylene glycol (MIRALAX ) 17 g packet Take 17 g by mouth daily. 30 each 1   No current facility-administered medications for this visit.   DG Knee Right Port Result Date: 03/18/2024 CLINICAL DATA:  Right knee pain. EXAM: PORTABLE RIGHT KNEE - 1-2 VIEW COMPARISON:  None Available. FINDINGS: No evidence of fracture, dislocation, or joint effusion. The alignment and joint spaces are normal. No evidence of arthropathy or other focal bone abnormality. Soft tissues are unremarkable. IMPRESSION: Negative radiographs of the right knee. Electronically Signed   By: Andrea Gasman M.D.   On: 03/18/2024 20:05    Review of Systems:   A ROS was performed including pertinent positives and negatives as documented in the HPI.  Physical Exam :   Constitutional: NAD and appears stated age Neurological: Alert and oriented Psych: Appropriate affect and cooperative There were no vitals taken for this visit.   Comprehensive Musculoskeletal Exam:    Exam of the right knee demonstrates active range of motion from 0 to 135 degrees without crepitus.  No medial or lateral joint line tenderness.  Minimal mild effusion present without overlying erythema or warmth.  Stable collaterals with varus and valgus stress.  Negative Lachman and McMurray.  Imaging:   Xray review from 12/2 (right knee 4 views): Negative for acute bony abnormality   I personally reviewed and interpreted the radiographs.      Assessment & Plan Acute right knee pain  She experiences acute right knee pain following a twisting injury, with normal X-rays.  Discussed possibility of internal derangement within the knee such as a meniscus tear that could be responsible for her symptoms given her mechanism of injury however no significant exam findings are present today.  Conservative management is  preferred.  A cortisone injection was discussed for symptom relief.  Patient would like to proceed with injection due to upcoming vacation, and this was performed into the right knee without difficulty.  Soft hinged knee brace was provided for support.  Continue NSAIDs for pain management and transition to full weightbearing as tolerated. An MRI will be considered if symptoms persist or worsen.     Procedure Note  Patient: ZELTA ENFIELD             Date of Birth: 1990-06-11           MRN: [id number]             Visit Date: 03/19/2024  Procedures: Visit Diagnoses:  1. Acute  pain of left knee     Large Joint Inj: R knee on 03/19/2024 2:53 PM Indications: pain Details: 22 G 1.5 in needle, anterolateral approach Medications: 4 mL lidocaine  1 %; 2 mL triamcinolone acetonide 40 MG/ML Outcome: tolerated well, no immediate complications Procedure, treatment alternatives, risks and benefits explained, specific risks discussed. Consent was given by the patient. Immediately prior to procedure a time out was called to verify the correct patient, procedure, equipment, support staff and site/side marked as required. Patient was prepped and draped in the usual sterile fashion.        I personally saw and evaluated the patient, and participated in the management and treatment plan.  Leonce Reveal, PA-C Orthopedics

## 2024-03-20 ENCOUNTER — Encounter (HOSPITAL_BASED_OUTPATIENT_CLINIC_OR_DEPARTMENT_OTHER): Payer: Self-pay

## 2024-04-18 ENCOUNTER — Ambulatory Visit (HOSPITAL_BASED_OUTPATIENT_CLINIC_OR_DEPARTMENT_OTHER): Admitting: Student

## 2024-04-18 DIAGNOSIS — M25561 Pain in right knee: Secondary | ICD-10-CM | POA: Diagnosis not present

## 2024-04-18 NOTE — Progress Notes (Signed)
 "                                Chief Complaint: Right knee pain    History of Present Illness  04/18/24: Patient presents today for follow-up evaluation of her right knee.  She states that injection performed at last visit gave her some relief, however she is still having sharp and throbbing pain in the anterior right knee.  This occasionally feels like she is being stabbed by glass in the front of the knee.  She is continuing to use a crutch for ambulation.  She has been utilizing RICE therapy as well as ibuprofen  and Tylenol .   03/19/24: Melinda Phillips is a 34 year old female who presents with right knee pain following an injury while climbing a ladder. She developed right knee pain on Saturday after a misstep on her mother's attic ladder caused her foot to shift to the right and she was unable to bear weight on the knee to climb back onto the ladder. She can walk and run without pain but has significant pain with climbing stairs or ladders. She notes clicking and popping in the right knee, worse after sitting for prolonged periods, followed by throbbing pain. She has been using rest, ice, compression, elevation, a compression knee sleeve, and naproxen  500 mg, with limited relief. She denies prior knee problems or surgeries. Pain is improved when standing and when sleeping with the knee slightly bent and is worse with elevation and prolonged sitting. She works at [employer] job that requires long periods of sitting, which aggravates the knee pain.   Surgical History:   None  PMH/PSH/Family History/Social History/Meds/Allergies:    Past Medical History:  Diagnosis Date   History of kidney stones    Mononucleosis    Strep throat 05/2015   Umbilical hernia    Past Surgical History:  Procedure Laterality Date   NO PAST SURGERIES     UMBILICAL HERNIA REPAIR N/A 07/02/2018   Procedure: LAPAROSCOPIC ASSISTED REPAIR OF UMBILICAL AND SUPRAUMBILICAL HERNIA WITH MESH;  Surgeon: Tanda Locus, MD;   Location: [facility];  Service: General;  Laterality: N/A;   Social History   Socioeconomic History   Marital status: Married    Spouse name: Not on file   Number of children: Not on file   Years of education: Not on file   Highest education level: Not on file  Occupational History   Not on file  Tobacco Use   Smoking status: Former    Current packs/day: 0.00    Types: Cigarettes    Quit date: 09/26/2013    Years since quitting: 10.5   Smokeless tobacco: Never  Vaping Use   Vaping status: Never Used  Substance and Sexual Activity   Alcohol use: No   Drug use: No   Sexual activity: Yes    Birth control/protection: None  Other Topics Concern   Not on file  Social History Narrative   Not on file   Social Drivers of Health   Tobacco Use: Medium Risk (03/18/2024)   Patient History    Smoking Tobacco Use: Former    Smokeless Tobacco Use: Never    Passive Exposure: Not on Actuary Strain: Not on file  Food Insecurity: Not on file  Transportation Needs: Not on file  Physical Activity: Not on file  Stress: Not on file  Social Connections: Not on file  Depression (PHQ2-9):  Not on file  Alcohol Screen: Not on file  Housing: Not on file  Utilities: Not on file  Health Literacy: Not on file   Family History  Problem Relation Age of Onset   Hypertension Mother    Hypertension Father    Heart disease Father    Heart attack Father    Hyperlipidemia Father    Diabetes Father    Hypertension Maternal Grandmother    Diabetes Maternal Grandmother    Stroke Maternal Grandmother    Heart attack Maternal Grandmother    Hypertension Maternal Grandfather    Hypertension Paternal Grandmother    Hypertension Paternal Grandfather    Heart disease Paternal Grandfather    Heart attack Paternal Grandfather    Diabetes Paternal Grandfather    Allergies  Allergen Reactions   Bee Venom Anaphylaxis   Cinnamon Anaphylaxis   Myfembree [Relugolix-Estradiol-Norethindrone]  Itching   Current Outpatient Medications  Medication Sig Dispense Refill   dicyclomine  (BENTYL ) 20 MG tablet Take 1 tablet (20 mg total) by mouth 2 (two) times daily. 20 tablet 0   levonorgestrel (MIRENA) 20 MCG/DAY IUD 1 each by Intrauterine route once.     loperamide  (IMODIUM ) 2 MG capsule Take 1 capsule (2 mg total) by mouth 4 (four) times daily as needed for diarrhea or loose stools. 12 capsule 0   naproxen  (NAPROSYN ) 500 MG tablet Take 1 tablet (500 mg total) by mouth 2 (two) times daily. 30 tablet 0   naproxen  sodium (ALEVE ) 220 MG tablet Take 220 mg by mouth daily as needed.     ondansetron  (ZOFRAN -ODT) 4 MG disintegrating tablet Take 1 tablet (4 mg total) by mouth every 8 (eight) hours as needed for nausea or vomiting. 20 tablet 0   polyethylene glycol (MIRALAX ) 17 g packet Take 17 g by mouth daily. 30 each 1   No current facility-administered medications for this visit.   No results found.   Review of Systems:   A ROS was performed including pertinent positives and negatives as documented in the HPI.  Physical Exam :   Constitutional: NAD and appears stated age Neurological: Alert and oriented Psych: Appropriate affect and cooperative There were no vitals taken for this visit.   Comprehensive Musculoskeletal Exam:    Right knee demonstrates tenderness over the patellar tendon and mildly over the medial joint line.  Active range of motion from 0 to 135 degrees.  No laxity with varus or valgus stress.  Patient is ambulating with assistance of a crutch.  Pain with McMurray.  Imaging:        Assessment & Plan Acute right knee pain  Patient continues to experience mainly anterior right knee pain after a twisting injury approximately 1 month ago.  Pain continues to persist despite conservative therapies including a cortisone injection, RICE therapy, and NSAIDs.  Given her exam today, I have some suspicion for an underlying meniscus tear as well as involvement of patellar  tendinitis.  Based on her persistent symptoms, we will plan to proceed with an MRI  to further assess for internal derangement or other soft tissue injury.  She can continue with weightbearing as tolerated with use of a brace.  Will plan to see her back shortly after MRI for review.     I personally saw and evaluated the patient, and participated in the management and treatment plan.  Leonce Reveal, PA-C Orthopedics  "

## 2024-04-28 ENCOUNTER — Ambulatory Visit
Admission: RE | Admit: 2024-04-28 | Discharge: 2024-04-28 | Disposition: A | Source: Ambulatory Visit | Attending: Student

## 2024-04-28 DIAGNOSIS — M25561 Pain in right knee: Secondary | ICD-10-CM

## 2024-05-07 ENCOUNTER — Ambulatory Visit (HOSPITAL_BASED_OUTPATIENT_CLINIC_OR_DEPARTMENT_OTHER): Payer: Self-pay | Admitting: Student
